# Patient Record
Sex: Female | Born: 1980 | Race: White | Hispanic: No | Marital: Married | Smoking: Never smoker
Health system: Southern US, Community
[De-identification: ages and names within clinical notes are randomized; demographics above are authoritative.]

## PROBLEM LIST (undated history)

## (undated) DIAGNOSIS — Z9221 Personal history of antineoplastic chemotherapy: Secondary | ICD-10-CM

## (undated) DIAGNOSIS — G620 Drug-induced polyneuropathy: Secondary | ICD-10-CM

## (undated) DIAGNOSIS — Z9889 Other specified postprocedural states: Secondary | ICD-10-CM

## (undated) DIAGNOSIS — C50919 Malignant neoplasm of unspecified site of unspecified female breast: Secondary | ICD-10-CM

## (undated) DIAGNOSIS — F419 Anxiety disorder, unspecified: Secondary | ICD-10-CM

## (undated) DIAGNOSIS — T8859XA Other complications of anesthesia, initial encounter: Secondary | ICD-10-CM

## (undated) DIAGNOSIS — R7303 Prediabetes: Secondary | ICD-10-CM

## (undated) DIAGNOSIS — M1990 Unspecified osteoarthritis, unspecified site: Secondary | ICD-10-CM

## (undated) DIAGNOSIS — J45909 Unspecified asthma, uncomplicated: Secondary | ICD-10-CM

## (undated) DIAGNOSIS — J189 Pneumonia, unspecified organism: Secondary | ICD-10-CM

## (undated) DIAGNOSIS — R519 Headache, unspecified: Secondary | ICD-10-CM

## (undated) DIAGNOSIS — Z923 Personal history of irradiation: Secondary | ICD-10-CM

## (undated) DIAGNOSIS — R9389 Abnormal findings on diagnostic imaging of other specified body structures: Secondary | ICD-10-CM

## (undated) DIAGNOSIS — K5909 Other constipation: Secondary | ICD-10-CM

## (undated) HISTORY — PX: MASTECTOMY, RADICAL: SHX710

## (undated) HISTORY — PX: BREAST LUMPECTOMY: SHX2

---

## 2010-10-25 HISTORY — PX: ORIF ELBOW FRACTURE: SUR928

## 2011-10-26 HISTORY — PX: ELBOW HARDWARE REMOVAL: SHX1493

## 2015-10-26 DIAGNOSIS — Z170 Estrogen receptor positive status [ER+]: Secondary | ICD-10-CM

## 2015-10-26 DIAGNOSIS — C50811 Malignant neoplasm of overlapping sites of right female breast: Secondary | ICD-10-CM

## 2015-10-26 HISTORY — DX: Estrogen receptor positive status (ER+): Z17.0

## 2015-10-26 HISTORY — DX: Estrogen receptor positive status (ER+): C50.811

## 2016-05-18 HISTORY — PX: BREAST LUMPECTOMY WITH AXILLARY LYMPH NODE DISSECTION: SHX5756

## 2016-10-08 HISTORY — PX: MODIFIED RADICAL MASTECTOMY W/ AXILLARY LYMPH NODE DISSECTION: SHX2042

## 2017-07-14 ENCOUNTER — Ambulatory Visit (HOSPITAL_COMMUNITY)
Admission: EM | Admit: 2017-07-14 | Discharge: 2017-07-14 | Disposition: A | Discharge status: Home / Self Care | Payer: Federal, State, Local not specified - PPO

## 2017-07-14 ENCOUNTER — Ambulatory Visit (INDEPENDENT_AMBULATORY_CARE_PROVIDER_SITE_OTHER): Payer: Federal, State, Local not specified - PPO

## 2017-07-14 ENCOUNTER — Encounter (HOSPITAL_COMMUNITY): Payer: Self-pay | Admitting: Emergency Medicine

## 2017-07-14 DIAGNOSIS — J209 Acute bronchitis, unspecified: Secondary | ICD-10-CM

## 2017-07-14 HISTORY — DX: Malignant neoplasm of unspecified site of unspecified female breast: C50.919

## 2017-07-14 MED ORDER — PROMETHAZINE-DM 6.25-15 MG/5ML PO SYRP: 5.0000 mL | ORAL_SOLUTION | Freq: Four times a day (QID) | ORAL | 0 refills | Status: DC | PRN

## 2017-07-14 MED ORDER — AZITHROMYCIN 250 MG PO TABS: 250.0000 mg | ORAL_TABLET | Freq: Every day | ORAL | 0 refills | Status: DC

## 2017-07-14 NOTE — ED Provider Notes (Signed)
[place]    CSN: [id number] Arrival date & time: 07/14/17  1142     History   Chief Complaint Chief Complaint  Patient presents with  . Cough    HPI Micheale Malmquist is a 36 y.o. female.   Subjective:   Cheryal Salas is a 36 y.o. female with a history of breast cancer s/p double mastectomy/lumpetomy/chemotherapy that presents for evaluation of a cough.  The cough is productive and has been waxing and waning over time. It is aggravated by nothing. She has tried several types of OTC therapies without any relief. Onset of symptoms was 9 days ago and has been unchanged since that time.  Patient denies any shortness of breath, nausea, vomiting, palpitations, lower extremity edema, calf pain/tenderness, chest pain, congestion, fevers, sweats, chills or other URI type symptoms. Patient does not have a history of asthma. Patient has not had recent travel. Patient does not have a history of smoking. Patient has not had a previous chest x-ray. Patient has not had a PPD done. Last chemotherapy June 2018.   The following portions of the patient's history were reviewed and updated as appropriate: allergies, current medications, past family history, past medical history, past social history, past surgical history and problem list.          Past Medical History:  Diagnosis Date  . Breast cancer ([place])     There are no active problems to display for this patient.   Past Surgical History:  Procedure Laterality Date  . MASTECTOMY, RADICAL      OB History    No data available       Home Medications    Prior to Admission medications   Medication Sig Start Date End Date Taking? Authorizing Provider  tamoxifen (NOLVADEX) 20 MG tablet Take 20 mg by mouth daily.   Yes [provider]    Family History No family history on file.  Social History Social History  Substance Use Topics  . Smoking status: Never Smoker  . Smokeless tobacco: Never Used  .  Alcohol use No     Allergies   Penicillins   Review of Systems Review of Systems  Constitutional: Negative for chills and fever.  HENT: Negative for congestion and postnasal drip.   Respiratory: Positive for cough. Negative for chest tightness, shortness of breath and wheezing.   Cardiovascular: Negative for chest pain, palpitations and leg swelling.  All other systems reviewed and are negative.    Physical Exam Triage Vital Signs ED Triage Vitals  Enc Vitals Group     BP 07/14/17 1242 130/77     Pulse Rate 07/14/17 1240 92     Resp 07/14/17 1240 16     Temp 07/14/17 1240 98.6 F (37 C)     Temp Source 07/14/17 1240 Oral     SpO2 07/14/17 1240 99 %     Weight 07/14/17 1241 138 lb (62.6 kg)     Height 07/14/17 1241 5' (1.524 m)     Head Circumference --      Peak Flow --      Pain Score 07/14/17 1241 5     Pain Loc --      Pain Edu? --      Excl. in [place]? --    No data found.   Updated Vital Signs BP 130/77   Pulse 92   Temp 98.6 F (37 C) (Oral)   Resp 16   Ht 5' (1.524 m)   Wt 138 lb (  62.6 kg)   SpO2 99%   BMI 26.95 kg/m   Visual Acuity Right Eye Distance:   Left Eye Distance:   Bilateral Distance:    Right Eye Near:   Left Eye Near:    Bilateral Near:     Physical Exam  Constitutional: She is oriented to person, place, and time. She appears well-developed and well-nourished.  Neck: Normal range of motion.  Cardiovascular: Normal rate and regular rhythm.   Pulmonary/Chest: Effort normal and breath sounds normal. No respiratory distress. She has no wheezes. She has no rales. She exhibits no tenderness.  Musculoskeletal: Normal range of motion.  Neurological: She is alert and oriented to person, place, and time.  Skin: Skin is warm and dry.  Psychiatric: She has a normal mood and affect.     UC Treatments / Results  Labs (all labs ordered are listed, but only abnormal results are displayed) Labs Reviewed - No data to display  EKG  EKG  Interpretation None       Radiology Dg Chest 2 View  Result Date: 07/14/2017 CLINICAL DATA:  Cough 9 days.  History breast cancer EXAM: CHEST  2 VIEW COMPARISON:  None. FINDINGS: The heart size and mediastinal contours are within normal limits. Both lungs are clear. The visualized skeletal structures are unremarkable. IMPRESSION: No active cardiopulmonary disease. Electronically Signed   By: Franchot Gallo M.D.   On: 07/14/2017 13:23    Procedures Procedures (including critical care time)  Medications Ordered in UC Medications - No data to display   Initial Impression / Assessment and Plan / UC Course  I have reviewed the triage vital signs and the nursing notes.  Pertinent labs & imaging results that were available during my care of the patient were reviewed by me and considered in my medical decision making (see chart for details).    Shawny Borkowski is a 36 y.o. female with a history of breast cancer s/p double mastectomy/lumpetomy/chemotherapy that presents with a 9-day history of productive cough. No shortness of breath, nausea, vomiting, palpitations, lower extremity edema, calf pain/tenderness, chest pain, congestion, fevers, sweats, chills or other URI type symptoms. Low suspicion for PE at this time.  WELLS SCORE FOR PE 1 = low probability for PE. CXR negative for anything acute. Due to her hx of breast cancer and immunocompromised status, will prescribe z-pack and PRN decongestants. Antibiotics per medication orders. Antitussives per medication orders. Avoid exposure to tobacco smoke and fumes. Call if shortness of breath worsens, blood in sputum, change in character of cough, development of fever or chills, inability to maintain nutrition and hydration. Avoid exposure to tobacco smoke and fumes. Follow-up PRN     Final Clinical Impressions(s) / UC Diagnoses   Final diagnoses:  Acute bronchitis, unspecified organism    New Prescriptions New Prescriptions   No  medications on file     Controlled Substance Prescriptions [place] Controlled Substance Registry consulted? Not Applicable   Enrique Sack, [place] 07/14/17 1351

## 2017-07-14 NOTE — ED Triage Notes (Signed)
PT reports productive cough for 9 days. PT finished chemo in June

## 2017-12-22 ENCOUNTER — Ambulatory Visit (INDEPENDENT_AMBULATORY_CARE_PROVIDER_SITE_OTHER): Payer: No Typology Code available for payment source | Admitting: Primary Care

## 2017-12-22 ENCOUNTER — Encounter: Payer: Self-pay | Admitting: Primary Care

## 2017-12-22 VITALS — BP 106/66 | HR 76 | Temp 98.2°F | Ht 59.75 in | Wt 140.5 lb

## 2017-12-22 DIAGNOSIS — C50919 Malignant neoplasm of unspecified site of unspecified female breast: Secondary | ICD-10-CM | POA: Insufficient documentation

## 2017-12-22 DIAGNOSIS — C50911 Malignant neoplasm of unspecified site of right female breast: Secondary | ICD-10-CM

## 2017-12-22 DIAGNOSIS — Z7689 Persons encountering health services in other specified circumstances: Secondary | ICD-10-CM

## 2017-12-22 NOTE — Assessment & Plan Note (Signed)
Diagnosed in June 2017 in [place]. Now following with [facility], follows monthly. Stable on letrozole.

## 2017-12-22 NOTE — Patient Instructions (Signed)
It was a pleasure to meet you today! Please don't hesitate to call or message me with any questions. Welcome to [place]!   

## 2017-12-22 NOTE — Progress Notes (Signed)
Subjective:    Patient ID: Monica Black, female    DOB: 07/04/1981, 37 y.o.   MRN: [id number]  HPI  Monica Black is a 37 year old female who presents today to establish care and discuss the problems mentioned below. Will obtain old records. Her last physical was several years ago, Pap smear was in 2015 or 2016.   1) Breast Cancer: Diagnosed in June 2017, located to the right breast. Underwent surgical lymph node removal, chemotherapy. She then underwent bilateral mastectomy in December 2017. Currently managed on letrozole 2.5 mg and is following with Oncology through [facility]. BRCA negative.     Review of Systems  Constitutional: Negative for unexpected weight change.  Respiratory: Negative for shortness of breath.   Cardiovascular: Negative for chest pain and palpitations.  Neurological: Negative for dizziness and headaches.       Past Medical History:  Diagnosis Date  . Breast cancer ([facility])      Social History   Socioeconomic History  . Marital status: Married    Spouse name: Not on file  . Number of children: Not on file  . Years of education: Not on file  . Highest education level: Not on file  Social Needs  . Financial resource strain: Not on file  . Food insecurity - worry: Not on file  . Food insecurity - inability: Not on file  . Transportation needs - medical: Not on file  . Transportation needs - non-medical: Not on file  Occupational History  . Not on file  Tobacco Use  . Smoking status: Never Smoker  . Smokeless tobacco: Never Used  Substance and Sexual Activity  . Alcohol use: No  . Drug use: No  . Sexual activity: Not on file  Other Topics Concern  . Not on file  Social History Narrative   Married.   No children.   Will be working with [employer] through the [employer] [place].   Enjoys shopping, movies, yoga.      Past Surgical History:  Procedure Laterality Date  . MASTECTOMY, RADICAL      Family History  Problem Relation  Age of Onset  . Asthma Father   . Lung cancer Paternal Grandfather     Allergies  Allergen Reactions  . Acetaminophen Itching  . Penicillins Hives    Current Outpatient Medications on File Prior to Visit  Medication Sig Dispense Refill  . letrozole (FEMARA) 2.5 MG tablet Take 2.5 mg by mouth daily.     No current facility-administered medications on file prior to visit.     BP 106/66   Pulse 76   Temp 98.2 F (36.8 C) (Oral)   Ht 4' 11.75" (1.518 m)   Wt 140 lb 8 oz (63.7 kg)   SpO2 98%   BMI 27.67 kg/m    Objective:   Physical Exam  Constitutional: She appears well-nourished.  Neck: Neck supple.  Cardiovascular: Normal rate and regular rhythm.  Pulmonary/Chest: Effort normal and breath sounds normal.  Skin: Skin is warm and dry.  Psychiatric: She has a normal mood and affect.          Assessment & Plan:   

## 2018-03-01 ENCOUNTER — Encounter: Payer: Self-pay | Admitting: Primary Care

## 2018-03-01 ENCOUNTER — Ambulatory Visit (INDEPENDENT_AMBULATORY_CARE_PROVIDER_SITE_OTHER): Payer: No Typology Code available for payment source | Admitting: Primary Care

## 2018-03-01 VITALS — BP 118/68 | HR 68 | Temp 98.2°F | Ht 59.75 in | Wt 145.8 lb

## 2018-03-01 DIAGNOSIS — Z0000 Encounter for general adult medical examination without abnormal findings: Secondary | ICD-10-CM | POA: Insufficient documentation

## 2018-03-01 LAB — LIPID PANEL
CHOL/HDL RATIO: 4
Cholesterol: 210 mg/dL — ABNORMAL HIGH (ref 0–200)
HDL: 50.2 mg/dL (ref >39.00)
LDL Cholesterol: 123 mg/dL — ABNORMAL HIGH (ref 0–99)
NONHDL: 159.7
Triglycerides: 186 mg/dL — ABNORMAL HIGH (ref 0.0–149.0)
VLDL: 37.2 mg/dL (ref 0.0–40.0)

## 2018-03-01 LAB — BASIC METABOLIC PANEL
BUN: 13 mg/dL (ref 6–23)
CO2: 29 meq/L (ref 19–32)
CREATININE: 0.75 mg/dL (ref 0.40–1.20)
Calcium: 9.9 mg/dL (ref 8.4–10.5)
Chloride: 102 mEq/L (ref 96–112)
GFR: 92.25 mL/min (ref >60.00)
Glucose, Bld: 91 mg/dL (ref 70–99)
Potassium: 3.9 mEq/L (ref 3.5–5.1)
Sodium: 140 mEq/L (ref 135–145)

## 2018-03-01 LAB — VITAMIN D 25 HYDROXY (VIT D DEFICIENCY, FRACTURES): VITD: 47.04 ng/mL (ref 30.00–100.00)

## 2018-03-01 NOTE — Patient Instructions (Addendum)
Stop by the lab prior to leaving today. I will notify you of your results once received.   Please schedule an appointment for your pap smear at your convenience.  Start exercising. You should be getting 150 minutes of moderate intensity exercise weekly.  Continue eating a healthy diet.  It was a pleasure to see you today!   Preventive Care 18-39 Years, Female Preventive care refers to lifestyle choices and visits with your health care provider that can promote health and wellness. What does preventive care include?  A yearly physical exam. This is also called an annual well check.  Dental exams once or twice a year.  Routine eye exams. Ask your health care provider how often you should have your eyes checked.  Personal lifestyle choices, including: ? Daily care of your teeth and gums. ? Regular physical activity. ? Eating a healthy diet. ? Avoiding tobacco and drug use. ? Limiting alcohol use. ? Practicing safe sex. ? Taking vitamin and mineral supplements as recommended by your health care provider. What happens during an annual well check? The services and screenings done by your health care provider during your annual well check will depend on your age, overall health, lifestyle risk factors, and family history of disease. Counseling Your health care provider may ask you questions about your:  Alcohol use.  Tobacco use.  Drug use.  Emotional well-being.  Home and relationship well-being.  Sexual activity.  Eating habits.  Work and work Statistician.  Method of birth control.  Menstrual cycle.  Pregnancy history.  Screening You may have the following tests or measurements:  Height, weight, and BMI.  Diabetes screening. This is done by checking your blood sugar (glucose) after you have not eaten for a while (fasting).  Blood pressure.  Lipid and cholesterol levels. These may be checked every 5 years starting at age 4.  Skin check.  Hepatitis C  blood test.  Hepatitis B blood test.  Sexually transmitted disease (STD) testing.  BRCA-related cancer screening. This may be done if you have a family history of breast, ovarian, tubal, or peritoneal cancers.  Pelvic exam and Pap test. This may be done every 3 years starting at age 60. Starting at age 39, this may be done every 5 years if you have a Pap test in combination with an HPV test.  Discuss your test results, treatment options, and if necessary, the need for more tests with your health care provider. Vaccines Your health care provider may recommend certain vaccines, such as:  Influenza vaccine. This is recommended every year.  Tetanus, diphtheria, and acellular pertussis (Tdap, Td) vaccine. You may need a Td booster every 10 years.  Varicella vaccine. You may need this if you have not been vaccinated.  HPV vaccine. If you are 29 or younger, you may need three doses over 6 months.  Measles, mumps, and rubella (MMR) vaccine. You may need at least one dose of MMR. You may also need a second dose.  Pneumococcal 13-valent conjugate (PCV13) vaccine. You may need this if you have certain conditions and were not previously vaccinated.  Pneumococcal polysaccharide (PPSV23) vaccine. You may need one or two doses if you smoke cigarettes or if you have certain conditions.  Meningococcal vaccine. One dose is recommended if you are age 40-21 years and a first-year college student living in a residence hall, or if you have one of several medical conditions. You may also need additional booster doses.  Hepatitis A vaccine. You may need this if  you have certain conditions or if you travel or work in places where you may be exposed to hepatitis A.  Hepatitis B vaccine. You may need this if you have certain conditions or if you travel or work in places where you may be exposed to hepatitis B.  Haemophilus influenzae type b (Hib) vaccine. You may need this if you have certain risk  factors.  Talk to your health care provider about which screenings and vaccines you need and how often you need them. This information is not intended to replace advice given to you by your health care provider. Make sure you discuss any questions you have with your health care provider. Document Released: 12/07/2001 Document Revised: 06/30/2016 Document Reviewed: 08/12/2015 Elsevier Interactive Patient Education  [employer].

## 2018-03-01 NOTE — Assessment & Plan Note (Signed)
Td due, kindly declines today. Pap smear due, she will schedule another appointment to have this done. Commended her on a healthy diet, recommended regular exercise. Exam unremarkable. Labs pending. Follow up in 1 year for CPE.

## 2018-03-01 NOTE — Progress Notes (Signed)
Subjective:    Patient ID: Monica Black, female    DOB: 1981-06-22, 37 y.o.   MRN: [id number]  HPI  Monica Black is a 37 year old female who presents today for complete physical. She is also needing form completed for foster care application.   Immunizations: -Tetanus: Unsure, believes it's been over 10 years, declines today. -Influenza: Did not complete last season   Diet: She endorses a healthy diet which is 90% organic. Breakfast: Fruit, rolled oats Lunch: Salad, veggies Dinner: Chicken, vegetables, potatoes, rice Snacks: Occasional handful of nuts, chips Desserts: Ice cream three times weekly Beverages: Coffee, water, occasional soda  Exercise: She is not exercising Eye exam: Completed in May 2019 Dental exam: No recent exam Pap Smear: Completed in 2015    Review of Systems  Constitutional: Negative for unexpected weight change.  HENT: Negative for rhinorrhea.   Respiratory: Negative for cough and shortness of breath.   Cardiovascular: Negative for chest pain.  Gastrointestinal: Negative for constipation and diarrhea.  Genitourinary: Negative for difficulty urinating and menstrual problem.  Musculoskeletal: Negative for arthralgias and myalgias.  Skin: Negative for rash.  Allergic/Immunologic: Negative for environmental allergies.  Neurological: Negative for dizziness, numbness and headaches.  Psychiatric/Behavioral: The patient is not nervous/anxious.        Past Medical History:  Diagnosis Date  . Breast cancer [facility])      Social History   Socioeconomic History  . Marital status: Married    Spouse name: Not on file  . Number of children: Not on file  . Years of education: Not on file  . Highest education level: Not on file  Occupational History  . Not on file  Social Needs  . Financial resource strain: Not on file  . Food insecurity:    Worry: Not on file    Inability: Not on file  . Transportation needs:    Medical: Not on file    Non-medical:  Not on file  Tobacco Use  . Smoking status: Never Smoker  . Smokeless tobacco: Never Used  Substance and Sexual Activity  . Alcohol use: No  . Drug use: No  . Sexual activity: Not on file  Lifestyle  . Physical activity:    Days per week: Not on file    Minutes per session: Not on file  . Stress: Not on file  Relationships  . Social connections:    Talks on phone: Not on file    Gets together: Not on file    Attends religious service: Not on file    Active member of club or organization: Not on file    Attends meetings of clubs or organizations: Not on file    Relationship status: Not on file  . Intimate partner violence:    Fear of current or ex partner: Not on file    Emotionally abused: Not on file    Physically abused: Not on file    Forced sexual activity: Not on file  Other Topics Concern  . Not on file  Social History Narrative   Married.   No children.   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   Enjoys shopping, movies, yoga.      Past Surgical History:  Procedure Laterality Date  . MASTECTOMY, RADICAL      Family History  Problem Relation Age of Onset  . Asthma Father   . Lung cancer Paternal Grandfather     Allergies  Allergen Reactions  . Acetaminophen  Itching  . Penicillins Hives    Current Outpatient Medications on File Prior to Visit  Medication Sig Dispense Refill  . letrozole (FEMARA) 2.5 MG tablet Take 2.5 mg by mouth daily.     No current facility-administered medications on file prior to visit.     BP 118/68   Pulse 68   Temp 98.2 F (36.8 C) (Oral)   Ht 4' 11.75" (1.518 m)   Wt 145 lb 12 oz (66.1 kg)   SpO2 98%   BMI 28.70 kg/m    Objective:   Physical Exam  Constitutional: She is oriented to person, place, and time. She appears well-nourished.  HENT:  Right Ear: Tympanic membrane and ear canal normal.  Left Ear: Tympanic membrane and ear canal normal.  Nose: Nose normal.  Mouth/Throat:  Oropharynx is clear and moist.  Eyes: Pupils are equal, round, and reactive to light. Conjunctivae and EOM are normal.  Neck: Neck supple. No thyromegaly present.  Cardiovascular: Normal rate and regular rhythm.  No murmur heard. Pulmonary/Chest: Effort normal and breath sounds normal. She has no rales.  Abdominal: Soft. Bowel sounds are normal. There is no tenderness.  Musculoskeletal: Normal range of motion.  Lymphadenopathy:    She has no cervical adenopathy.  Neurological: She is alert and oriented to person, place, and time. She has normal reflexes. No cranial nerve deficit.  Skin: Skin is warm and dry. No rash noted.  Psychiatric: She has a normal mood and affect.          Assessment & Plan:

## 2018-05-01 ENCOUNTER — Emergency Department
Admission: EM | Admit: 2018-05-01 | Discharge: 2018-05-02 | Disposition: A | Discharge status: Home / Self Care | Payer: No Typology Code available for payment source | Attending: Emergency Medicine | Admitting: Emergency Medicine

## 2018-05-01 ENCOUNTER — Encounter: Payer: Self-pay | Admitting: *Deleted

## 2018-05-01 ENCOUNTER — Other Ambulatory Visit: Payer: Self-pay

## 2018-05-01 DIAGNOSIS — R1031 Right lower quadrant pain: Secondary | ICD-10-CM

## 2018-05-01 DIAGNOSIS — Z79899 Other long term (current) drug therapy: Secondary | ICD-10-CM | POA: Diagnosis not present

## 2018-05-01 DIAGNOSIS — Z853 Personal history of malignant neoplasm of breast: Secondary | ICD-10-CM | POA: Diagnosis not present

## 2018-05-01 LAB — COMPREHENSIVE METABOLIC PANEL
ALK PHOS: 89 U/L (ref 38–126)
ALT: 30 U/L (ref 0–44)
AST: 31 U/L (ref 15–41)
Albumin: 4.3 g/dL (ref 3.5–5.0)
Anion gap: 10 (ref 5–15)
BUN: 15 mg/dL (ref 6–20)
CALCIUM: 9.4 mg/dL (ref 8.9–10.3)
CO2: 26 mmol/L (ref 22–32)
CREATININE: 0.75 mg/dL (ref 0.44–1.00)
Chloride: 104 mmol/L (ref 98–111)
GFR calc Af Amer: >60 mL/min (ref >60)
GFR calc non Af Amer: >60 mL/min (ref >60)
GLUCOSE: 113 mg/dL — AB (ref 70–99)
Potassium: 3.8 mmol/L (ref 3.5–5.1)
Sodium: 140 mmol/L (ref 135–145)
Total Bilirubin: 0.5 mg/dL (ref 0.3–1.2)
Total Protein: 7.7 g/dL (ref 6.5–8.1)

## 2018-05-01 LAB — URINALYSIS, COMPLETE (UACMP) WITH MICROSCOPIC
Bacteria, UA: NONE SEEN
Bilirubin Urine: NEGATIVE
GLUCOSE, UA: NEGATIVE mg/dL
Ketones, ur: NEGATIVE mg/dL
Nitrite: NEGATIVE
Protein, ur: NEGATIVE mg/dL
SPECIFIC GRAVITY, URINE: 1.003 — AB (ref 1.005–1.030)
Squamous Epithelial / LPF: NONE SEEN (ref 0–5)
pH: 6 (ref 5.0–8.0)

## 2018-05-01 LAB — TROPONIN I

## 2018-05-01 LAB — CBC
HCT: 39.1 % (ref 35.0–47.0)
Hemoglobin: 13.9 g/dL (ref 12.0–16.0)
MCH: 30.7 pg (ref 26.0–34.0)
MCHC: 35.6 g/dL (ref 32.0–36.0)
MCV: 86.4 fL (ref 80.0–100.0)
PLATELETS: 355 10*3/uL (ref 150–440)
RBC: 4.53 MIL/uL (ref 3.80–5.20)
RDW: 12.6 % (ref 11.5–14.5)
WBC: 7.2 10*3/uL (ref 3.6–11.0)

## 2018-05-01 LAB — LIPASE, BLOOD: LIPASE: 35 U/L (ref 11–51)

## 2018-05-01 LAB — POCT PREGNANCY, URINE: Preg Test, Ur: NEGATIVE

## 2018-05-01 NOTE — ED Triage Notes (Signed)
Pt has right side abd pain with cramping.  No v/d.  Pt also reports chest tightness. no sob.  Hx breast cancer.  Pt alert sppech clear.

## 2018-05-01 NOTE — ED Provider Notes (Signed)
[facility] Emergency Department Provider Note  ____________________________________________   First MD Initiated Contact with Patient 05/01/18 2300     (approximate)  I have reviewed the triage vital signs and the nursing notes.   HISTORY  Chief Complaint Abdominal Pain   HPI Monica Black is a 37 y.o. female who comes to the emergency department with several days of right-sided abdominal pain and cramping.  She has a past medical history of stage III breast cancer in remission.  She had bilateral mastectomies and is status post chemotherapy and radiation and is currently taking an immune modulator.  Her symptoms are in her right upper quadrant and right lower quadrant.  They are intermittent and cramping.  Mild severity nonradiating.  Nothing seems to make them better or worse.  No diarrhea.  No history of abdominal surgeries.  She denies fevers or chills.    Past Medical History:  Diagnosis Date  . Breast cancer [facility])     Patient Active Problem List   Diagnosis Date Noted  . Preventative health care 03/01/2018  . Breast cancer ([place]) 12/22/2017    Past Surgical History:  Procedure Laterality Date  . MASTECTOMY, RADICAL      Prior to Admission medications   Medication Sig Start Date End Date Taking? Authorizing Provider  letrozole (FEMARA) 2.5 MG tablet Take 2.5 mg by mouth daily.    [provider]    Allergies Acetaminophen and Penicillins  Family History  Problem Relation Age of Onset  . Asthma Father   . Lung cancer Paternal Grandfather     Social History Social History   Tobacco Use  . Smoking status: Never Smoker  . Smokeless tobacco: Never Used  Substance Use Topics  . Alcohol use: No  . Drug use: No    Review of Systems Constitutional: No fever/chills Eyes: No visual changes. ENT: No sore throat. Cardiovascular: Denies chest pain. Respiratory: Denies shortness of breath. Gastrointestinal: Positive for  abdominal pain.  No nausea, no vomiting.  No diarrhea.  No constipation. Genitourinary: Negative for dysuria. Musculoskeletal: Negative for back pain. Skin: Negative for rash. Neurological: Negative for headaches, focal weakness or numbness.   ____________________________________________   PHYSICAL EXAM:  VITAL SIGNS: ED Triage Vitals  Enc Vitals Group     BP 05/01/18 2100 (!) 155/86     Pulse Rate 05/01/18 2100 (!) 101     Resp 05/01/18 2100 19     Temp 05/01/18 2100 (!) 100.8 F (38.2 C)     Temp Source 05/01/18 2100 Oral     SpO2 05/01/18 2100 100 %     Weight 05/01/18 2004 145 lb (65.8 kg)     Height 05/01/18 2004 5' (1.524 m)     Head Circumference --      Peak Flow --      Pain Score --      Pain Loc --      Pain Edu? --      Excl. in [place]? --     Constitutional: Alert and oriented x4 pleasant cooperative no distress Eyes: PERRL EOMI. Head: Atraumatic. Nose: No congestion/rhinnorhea. Mouth/Throat: No trismus Neck: No stridor.   Cardiovascular: Normal rate, regular rhythm. Grossly normal heart sounds.  Good peripheral circulation. Respiratory: Normal respiratory effort.  No retractions. Lungs CTAB and moving good air Gastrointestinal: Soft mild right lower quadrant and right upper quadrant tenderness with no rebound no guarding or peritonitis no McBurney's tenderness negative Rovsing's and no costovertebral tenderness Musculoskeletal: No lower extremity  edema   Neurologic:  Normal speech and language. No gross focal neurologic deficits are appreciated. Skin:  Skin is warm, dry and intact. No rash noted. Psychiatric: Mood and affect are normal. Speech and behavior are normal.    ____________________________________________   DIFFERENTIAL includes but not limited to  Appendicitis, biliary colic, cholecystitis, pyelonephritis, nephrolithiasis ____________________________________________   LABS (all labs ordered are listed, but only abnormal results are  displayed)  Labs Reviewed  COMPREHENSIVE METABOLIC PANEL - Abnormal; Notable for the following components:      Result Value   Glucose, Bld 113 (*)    All other components within normal limits  URINALYSIS, COMPLETE (UACMP) WITH MICROSCOPIC - Abnormal; Notable for the following components:   Color, Urine STRAW (*)    APPearance CLEAR (*)    Specific Gravity, Urine 1.003 (*)    Hgb urine dipstick SMALL (*)    Leukocytes, UA SMALL (*)    All other components within normal limits  LIPASE, BLOOD  CBC  TROPONIN I  POCT PREGNANCY, URINE    Lab work reviewed by me with no acute disease __________________________________________  EKG  ED ECG REPORT I, Darel Hong, the attending physician, personally viewed and interpreted this ECG.  Date: 05/03/2018 EKG Time:  Rate: 78 Rhythm: normal sinus rhythm QRS Axis: normal Intervals: normal ST/T Wave abnormalities: normal Narrative Interpretation: no evidence of acute ischemia  ____________________________________________  RADIOLOGY  CT abdomen pelvis reviewed by me with no acute disease ____________________________________________   PROCEDURES  Procedure(s) performed: no  Procedures  Critical Care performed: no  ____________________________________________   INITIAL IMPRESSION / ASSESSMENT AND PLAN / ED COURSE  Pertinent labs & imaging results that were available during my care of the patient were reviewed by me and considered in my medical decision making (see chart for details).   Patient arrives well-appearing and hemodynamically stable although with 100.8 degrees oral temperature and new abdominal pain.  Differential is broad but includes appendicitis and other intra-abdominal infection so labs, urinalysis, and CT scan are pending.  Defer antibiotics at this point as she is clearly not septic.  Fortunately the patient's symptoms are improved and her CT scan is unremarkable.  She feels significant relief.  I  explained to the patient that diagnostic uncertainty still exists but I was comfortable having her follow-up with her primary care physician.  Strict return precautions have been given and patient verbalizes understanding and agreement with the plan.      ____________________________________________   FINAL CLINICAL IMPRESSION(S) / ED DIAGNOSES  Final diagnoses:  Right lower quadrant abdominal pain      NEW MEDICATIONS STARTED DURING THIS VISIT:  Discharge Medication List as of 05/02/2018  3:05 AM       Note:  This document was prepared using Dragon voice recognition software and may include unintentional dictation errors.     Darel Hong, MD 05/03/18 1214

## 2018-05-02 ENCOUNTER — Emergency Department: Payer: No Typology Code available for payment source

## 2018-05-02 ENCOUNTER — Encounter: Payer: Self-pay | Admitting: Radiology

## 2018-05-02 MED ORDER — IOHEXOL 300 MG/ML  SOLN
100.0000 mL | Freq: Once | INTRAMUSCULAR | Status: AC | PRN
  Administered 2018-05-02: 100 mL via INTRAVENOUS

## 2018-05-02 NOTE — Discharge Instructions (Signed)
Fortunately today your blood work, your urine, and your CT scan were reassuring.  Please follow-up with your primary care physician in 2 days for recheck and return to the emergency department sooner for any concerns.  It was a pleasure to take care of you today, and thank you for coming to our emergency department.  If you have any questions or concerns before leaving please ask the nurse to grab me and I'm more than happy to go through your aftercare instructions again.  If you were prescribed any opioid pain medication today such as Norco, Vicodin, Percocet, morphine, hydrocodone, or oxycodone please make sure you do not drive when you are taking this medication as it can alter your ability to drive safely.  If you have any concerns once you are home that you are not improving or are in fact getting worse before you can make it to your follow-up appointment, please do not hesitate to call 911 and come back for further evaluation.  Darel Hong, MD  Results for orders placed or performed during the hospital encounter of 05/01/18  Lipase, blood  Result Value Ref Range   Lipase 35 11 - 51 U/L  Comprehensive metabolic panel  Result Value Ref Range   Sodium 140 135 - 145 mmol/L   Potassium 3.8 3.5 - 5.1 mmol/L   Chloride 104 98 - 111 mmol/L   CO2 26 22 - 32 mmol/L   Glucose, Bld 113 (H) 70 - 99 mg/dL   BUN 15 6 - 20 mg/dL   Creatinine, Ser 0.75 0.44 - 1.00 mg/dL   Calcium 9.4 8.9 - 10.3 mg/dL   Total Protein 7.7 6.5 - 8.1 g/dL   Albumin 4.3 3.5 - 5.0 g/dL   AST 31 15 - 41 U/L   ALT 30 0 - 44 U/L   Alkaline Phosphatase 89 38 - 126 U/L   Total Bilirubin 0.5 0.3 - 1.2 mg/dL   GFR calc non Af Amer >60 >60 mL/min   GFR calc Af Amer >60 >60 mL/min   Anion gap 10 5 - 15  CBC  Result Value Ref Range   WBC 7.2 3.6 - 11.0 K/uL   RBC 4.53 3.80 - 5.20 MIL/uL   Hemoglobin 13.9 12.0 - 16.0 g/dL   HCT 39.1 35.0 - 47.0 %   MCV 86.4 80.0 - 100.0 fL   MCH 30.7 26.0 - 34.0 pg   MCHC 35.6 32.0 -  36.0 g/dL   RDW 12.6 11.5 - 14.5 %   Platelets 355 150 - 440 K/uL  Urinalysis, Complete w Microscopic  Result Value Ref Range   Color, Urine STRAW (A) YELLOW   APPearance CLEAR (A) CLEAR   Specific Gravity, Urine 1.003 (L) 1.005 - 1.030   pH 6.0 5.0 - 8.0   Glucose, UA NEGATIVE NEGATIVE mg/dL   Hgb urine dipstick SMALL (A) NEGATIVE   Bilirubin Urine NEGATIVE NEGATIVE   Ketones, ur NEGATIVE NEGATIVE mg/dL   Protein, ur NEGATIVE NEGATIVE mg/dL   Nitrite NEGATIVE NEGATIVE   Leukocytes, UA SMALL (A) NEGATIVE   RBC / HPF 0-5 0 - 5 RBC/hpf   WBC, UA 6-10 0 - 5 WBC/hpf   Bacteria, UA NONE SEEN NONE SEEN   Squamous Epithelial / LPF NONE SEEN 0 - 5  Troponin I  Result Value Ref Range   Troponin I <0.03 <0.03 ng/mL  Pregnancy, urine POC  Result Value Ref Range   Preg Test, Ur NEGATIVE NEGATIVE   Ct Abdomen Pelvis W Contrast  Result Date: 05/02/2018 CLINICAL DATA:  Right-sided abdominal pain. Hematuria and bloody stools. Abdominal infection. EXAM: CT ABDOMEN AND PELVIS WITH CONTRAST TECHNIQUE: Multidetector CT imaging of the abdomen and pelvis was performed using the standard protocol following bolus administration of intravenous contrast. CONTRAST:  144mL OMNIPAQUE IOHEXOL 300 MG/ML  SOLN COMPARISON:  None. FINDINGS: Lower chest: Subpleural reticulation in the right middle lobe likely post radiation/treatment related change in patient with history of breast cancer. No pleural fluid or focal consolidation. Heart is normal in size. Hepatobiliary: No focal liver abnormality is seen. No gallstones, gallbladder wall thickening, or biliary dilatation. Pancreas: No ductal dilatation or inflammation. Spleen: Normal in size without focal abnormality. Adrenals/Urinary Tract: Normal adrenal glands. No hydronephrosis or perinephric edema. Homogeneous renal enhancement. No focal renal lesion. Urinary bladder is physiologically distended without wall thickening. Stomach/Bowel: Stomach is nondistended limiting  assessment. No bowel wall thickening, inflammatory change or obstruction. Slight fecalization of distal small bowel contents. Moderate colonic stool burden in the proximal colon. Small volume of stool distally. No abnormal rectal distention. Normal appendix, for example coronal image 35. Vascular/Lymphatic: No significant vascular findings are present. No enlarged abdominal or pelvic lymph nodes. Reproductive: Uterus and bilateral adnexa are unremarkable. Other: No free air, free fluid, or intra-abdominal fluid collection. Tiny fat containing umbilical hernia. Musculoskeletal: There are no acute or suspicious osseous abnormalities. IMPRESSION: No acute findings or explanation for abdominal pain. Electronically Signed   By: Jeb Levering M.D.   On: 05/02/2018 02:14

## 2018-10-21 ENCOUNTER — Emergency Department (HOSPITAL_COMMUNITY): Payer: No Typology Code available for payment source

## 2018-10-21 ENCOUNTER — Emergency Department (HOSPITAL_COMMUNITY)
Admission: EM | Admit: 2018-10-21 | Discharge: 2018-10-21 | Disposition: A | Discharge status: Home / Self Care | Payer: No Typology Code available for payment source | Attending: Emergency Medicine | Admitting: Emergency Medicine

## 2018-10-21 ENCOUNTER — Encounter (HOSPITAL_COMMUNITY): Payer: Self-pay

## 2018-10-21 DIAGNOSIS — R072 Precordial pain: Secondary | ICD-10-CM | POA: Insufficient documentation

## 2018-10-21 LAB — I-STAT BETA HCG BLOOD, ED (MC, WL, AP ONLY): I-stat hCG, quantitative: <5 m[IU]/mL (ref <5)

## 2018-10-21 LAB — CBC WITH DIFFERENTIAL/PLATELET
ABS IMMATURE GRANULOCYTES: 0 10*3/uL (ref 0.00–0.07)
BASOS PCT: 1 %
Basophils Absolute: 0.1 10*3/uL (ref 0.0–0.1)
Eosinophils Absolute: 0.1 10*3/uL (ref 0.0–0.5)
Eosinophils Relative: 3 %
HCT: 38.2 % (ref 36.0–46.0)
Hemoglobin: 12.6 g/dL (ref 12.0–15.0)
IMMATURE GRANULOCYTES: 0 %
Lymphocytes Relative: 39 %
Lymphs Abs: 1.5 10*3/uL (ref 0.7–4.0)
MCH: 28.8 pg (ref 26.0–34.0)
MCHC: 33 g/dL (ref 30.0–36.0)
MCV: 87.4 fL (ref 80.0–100.0)
MONO ABS: 0.4 10*3/uL (ref 0.1–1.0)
Monocytes Relative: 10 %
NEUTROS ABS: 1.8 10*3/uL (ref 1.7–7.7)
NEUTROS PCT: 47 %
PLATELETS: 276 10*3/uL (ref 150–400)
RBC: 4.37 MIL/uL (ref 3.87–5.11)
RDW: 12.6 % (ref 11.5–15.5)
WBC: 3.9 10*3/uL — AB (ref 4.0–10.5)
nRBC: 0 % (ref 0.0–0.2)

## 2018-10-21 LAB — COMPREHENSIVE METABOLIC PANEL
ALT: 15 U/L (ref 0–44)
ANION GAP: 9 (ref 5–15)
AST: 39 U/L (ref 15–41)
Albumin: 3.8 g/dL (ref 3.5–5.0)
Alkaline Phosphatase: 86 U/L (ref 38–126)
BUN: 12 mg/dL (ref 6–20)
CHLORIDE: 105 mmol/L (ref 98–111)
CO2: 26 mmol/L (ref 22–32)
Calcium: 9.5 mg/dL (ref 8.9–10.3)
Creatinine, Ser: 0.7 mg/dL (ref 0.44–1.00)
GFR calc non Af Amer: >60 mL/min (ref >60)
Glucose, Bld: 107 mg/dL — ABNORMAL HIGH (ref 70–99)
POTASSIUM: 5 mmol/L (ref 3.5–5.1)
Sodium: 140 mmol/L (ref 135–145)
Total Bilirubin: 1.3 mg/dL — ABNORMAL HIGH (ref 0.3–1.2)
Total Protein: 7 g/dL (ref 6.5–8.1)

## 2018-10-21 LAB — D-DIMER, QUANTITATIVE (NOT AT ARMC): D DIMER QUANT: 0.36 ug{FEU}/mL (ref 0.00–0.50)

## 2018-10-21 LAB — TROPONIN I

## 2018-10-21 MED ORDER — KETOROLAC TROMETHAMINE 30 MG/ML IJ SOLN: 30.0000 mg | Freq: Once | INTRAMUSCULAR | Status: DC

## 2018-10-21 MED ORDER — KETOROLAC TROMETHAMINE 10 MG PO TABS: 10.0000 mg | ORAL_TABLET | Freq: Four times a day (QID) | ORAL | 0 refills | Status: DC | PRN

## 2018-10-21 NOTE — Discharge Instructions (Signed)
You have been seen in the Emergency Department (ED) today for chest pain.  As we have discussed todays test results are normal, but you may require further testing.  Please follow up with the recommended doctor as instructed above in these documents regarding todays emergent visit and your recent symptoms to discuss further management.   Return to the Emergency Department (ED) if you experience any further chest pain/pressure/tightness, difficulty breathing, or sudden sweating, or other symptoms that concern you.   Chest Pain (Nonspecific) It is often hard to give a specific diagnosis for the cause of chest pain. There is always a chance that your pain could be related to something serious, such as a heart attack or a blood clot in the lungs. You need to follow up with your health care provider for further evaluation. CAUSES  Heartburn. Pneumonia or bronchitis. Anxiety or stress. Inflammation around your heart (pericarditis) or lung (pleuritis or pleurisy). A blood clot in the lung. A collapsed lung (pneumothorax). It can develop suddenly on its own (spontaneous pneumothorax) or from trauma to the chest. Shingles infection (herpes zoster virus). The chest wall is composed of bones, muscles, and cartilage. Any of these can be the source of the pain. The bones can be bruised by injury. The muscles or cartilage can be strained by coughing or overwork. The cartilage can be affected by inflammation and become sore (costochondritis). DIAGNOSIS  Lab tests or other studies may be needed to find the cause of your pain. Your health care provider may have you take a test called an ambulatory electrocardiogram (ECG). An ECG records your heartbeat patterns over a 24-hour period. You may also have other tests, such as: Transthoracic echocardiogram (TTE). During echocardiography, sound waves are used to evaluate how blood flows through your heart. Transesophageal echocardiogram (TEE). Cardiac monitoring.  This allows your health care provider to monitor your heart rate and rhythm in real time. Holter monitor. This is a portable device that records your heartbeat and can help diagnose heart arrhythmias. It allows your health care provider to track your heart activity for several days, if needed. Stress tests by exercise or by giving medicine that makes the heart beat faster. TREATMENT  Treatment depends on what may be causing your chest pain. Treatment may include: Acid blockers for heartburn. Anti-inflammatory medicine. Pain medicine for inflammatory conditions. Antibiotics if an infection is present. You may be advised to change lifestyle habits. This includes stopping smoking and avoiding alcohol, caffeine, and chocolate. You may be advised to keep your head raised (elevated) when sleeping. This reduces the chance of acid going backward from your stomach into your esophagus. Most of the time, nonspecific chest pain will improve within 2-3 days with rest and mild pain medicine.  HOME CARE INSTRUCTIONS  If antibiotics were prescribed, take them as directed. Finish them even if you start to feel better. For the next few days, avoid physical activities that bring on chest pain. Continue physical activities as directed. Do not use any tobacco products, including cigarettes, chewing tobacco, or electronic cigarettes. Avoid drinking alcohol. Only take medicine as directed by your health care provider. Follow your health care provider's suggestions for further testing if your chest pain does not go away. Keep any follow-up appointments you made. If you do not go to an appointment, you could develop lasting (chronic) problems with pain. If there is any problem keeping an appointment, call to reschedule. SEEK MEDICAL CARE IF:  Your chest pain does not go away, even after treatment. You  have a rash with blisters on your chest. You have a fever. SEEK IMMEDIATE MEDICAL CARE IF:  You have increased chest  pain or pain that spreads to your arm, neck, jaw, back, or abdomen. You have shortness of breath. You have an increasing cough, or you cough up blood. You have severe back or abdominal pain. You feel nauseous or vomit. You have severe weakness. You faint. You have chills. This is an emergency. Do not wait to see if the pain will go away. Get medical help at once. Call your local emergency services (911 in [place] Do not drive yourself to the hospital. MAKE SURE YOU:  Understand these instructions. Will watch your condition. Will get help right away if you are not doing well or get worse. Document Released: 07/21/2005 Document Revised: 10/16/2013 Document Reviewed: 05/16/2008 [facility] Patient Information 2015 [place], [place]. This information is not intended to replace advice given to you by your health care provider. Make sure you discuss any questions you have with your health care provider.

## 2018-10-21 NOTE — ED Provider Notes (Signed)
Emergency Department Provider Note   I have reviewed the triage vital signs and the nursing notes.   HISTORY  Chief Complaint Chest Pain and Back Pain   HPI Monica Black is a 37 y.o. female with PMH of breast cancer in remission presents to the emergency department with left-sided chest pain and midthoracic back pain for the past 3 days.  She describes constant pain with intermittent worsening.  No clear provoking or modifying factors.  She reports some mild shortness of breath.  No prior history of PE or DVT.  She denies any fevers, chills, productive cough.  No similar pain in the past.  Denies trauma associated with the onset of pain.  She has been taking Tylenol at home with no significant relief in symptoms.  Her last chemotherapy was in 2018.    Past Medical History:  Diagnosis Date  . Breast cancer [facility])     Patient Active Problem List   Diagnosis Date Noted  . Preventative health care 03/01/2018  . Breast cancer ([place]) 12/22/2017    Past Surgical History:  Procedure Laterality Date  . MASTECTOMY, RADICAL      Current Outpatient Rx  . Order #: [id number] Class: Print  . Order #: [id number] Class: Historical Med    Allergies Acetaminophen and Penicillins  Family History  Problem Relation Age of Onset  . Asthma Father   . Lung cancer Paternal Grandfather     Social History Social History   Tobacco Use  . Smoking status: Never Smoker  . Smokeless tobacco: Never Used  Substance Use Topics  . Alcohol use: No  . Drug use: No    Review of Systems  Constitutional: No fever/chills Eyes: No visual changes. ENT: No sore throat. Cardiovascular: Positive chest pain. Respiratory: Positive shortness of breath. Gastrointestinal: No abdominal pain.  No nausea, no vomiting.  No diarrhea.  No constipation. Genitourinary: Negative for dysuria. Musculoskeletal: Negative for back pain. Skin: Negative for rash. Neurological: Negative for headaches, focal weakness  or numbness.  10-point ROS otherwise negative.  ____________________________________________   PHYSICAL EXAM:  VITAL SIGNS: ED Triage Vitals  Enc Vitals Group     BP 10/21/18 0931 124/85     Pulse Rate 10/21/18 0931 79     Resp 10/21/18 1000 16     Temp 10/21/18 0931 98.6 F (37 C)     Temp Source 10/21/18 0931 Oral     SpO2 10/21/18 0931 99 %     Pain Score 10/21/18 0951 7   Constitutional: Alert and oriented. Well appearing and in no acute distress. Eyes: Conjunctivae are normal.  Head: Atraumatic. Nose: No congestion/rhinnorhea. Mouth/Throat: Mucous membranes are moist.  Oropharynx non-erythematous. Neck: No stridor. Cardiovascular: Normal rate, regular rhythm. Good peripheral circulation. Grossly normal heart sounds.   Respiratory: Normal respiratory effort.  No retractions. Lungs CTAB. Gastrointestinal: Soft and nontender. No distention.  Musculoskeletal: No lower extremity tenderness nor edema. No gross deformities of extremities. Neurologic:  Normal speech and language. No gross focal neurologic deficits are appreciated.  Skin:  Skin is warm, dry and intact. No rash noted.  ____________________________________________   LABS (all labs ordered are listed, but only abnormal results are displayed)  Labs Reviewed  COMPREHENSIVE METABOLIC PANEL - Abnormal; Notable for the following components:      Result Value   Glucose, Bld 107 (*)    Total Bilirubin 1.3 (*)    All other components within normal limits  CBC WITH DIFFERENTIAL/PLATELET - Abnormal; Notable for the following components:  WBC 3.9 (*)    All other components within normal limits  TROPONIN I  D-DIMER, QUANTITATIVE (NOT AT [facility])  I-STAT BETA HCG BLOOD, ED ([facility], [facility], [facility] ONLY)   ____________________________________________  EKG   EKG Interpretation  Date/Time:  Saturday October 21 2018 09:33:05 EST Ventricular Rate:  76 PR Interval:  144 QRS Duration: 78 QT Interval:  390 QTC  Calculation: 438 R Axis:   69 Text Interpretation:  Normal sinus rhythm with sinus arrhythmia Normal ECG No STEMI.  Confirmed by Nanda Quinton [phone]) on 10/21/2018 9:49:58 AM       ____________________________________________  RADIOLOGY  Dg Chest 2 View  Result Date: 10/21/2018 CLINICAL DATA:  Left-sided chest pain and thoracic and lumbar back pain. Stage III breast cancer. EXAM: CHEST - 2 VIEW COMPARISON:  07/14/2017 FINDINGS: The heart size and mediastinal contours are within normal limits. Both lungs are clear. The visualized skeletal structures are unremarkable. IMPRESSION: No active cardiopulmonary disease. Electronically Signed   By: Lorriane Shire M.D.   On: 10/21/2018 11:24    ____________________________________________   PROCEDURES  Procedure(s) performed:   Procedures  None ____________________________________________   INITIAL IMPRESSION / ASSESSMENT AND PLAN / ED COURSE  Pertinent labs & imaging results that were available during my care of the patient were reviewed by me and considered in my medical decision making (see chart for details).  Patient with past medical history of breast cancer currently in remission presents with chest pain radiating to the back.  Symptoms been constant for the past 3 days.  No infectious symptoms.  Plan for screening labs including troponin and d-dimer.  Patient's EKG is without acute ischemic changes.  Chest x-ray, labs reviewed with no acute findings.  Suspect MSK etiology.  Patient will follow-up with PCP.  I do not feel that trending biomarkers would be beneficial with several days of constant pain.  Offered reflux medication but patient refusing.  Advised warm compress, NSAIDs, and PCP follow up.   At this time, I do not feel there is any life-threatening condition present. I have reviewed and discussed all results (EKG, imaging, lab, urine as appropriate), exam findings with patient. I have reviewed nursing notes and  appropriate previous records.  I feel the patient is safe to be discharged home without further emergent workup. Discussed usual and customary return precautions. Patient and family (if present) verbalize understanding and are comfortable with this plan.  Patient will follow-up with their primary care provider. If they do not have a primary care provider, information for follow-up has been provided to them. All questions have been answered.  ____________________________________________  FINAL CLINICAL IMPRESSION(S) / ED DIAGNOSES  Final diagnoses:  Precordial chest pain     MEDICATIONS GIVEN DURING THIS VISIT:  Medications  ketorolac (TORADOL) 30 MG/ML injection 30 mg (0 mg Intravenous Hold 10/21/18 1037)     NEW OUTPATIENT MEDICATIONS STARTED DURING THIS VISIT:  Discharge Medication List as of 10/21/2018 11:51 AM    START taking these medications   Details  ketorolac (TORADOL) 10 MG tablet Take 1 tablet (10 mg total) by mouth every 6 (six) hours as needed for moderate pain., Starting Sat 10/21/2018, Print        Note:  This document was prepared using Dragon voice recognition software and may include unintentional dictation errors.  Nanda Quinton, MD Emergency Medicine    Long, [facility], MD 10/21/18 1311

## 2018-10-21 NOTE — ED Triage Notes (Signed)
Pt presents for evaluation of L sided chest pain and middle thoracic and lumbar back pain x 3 days constantly. Pt with hx of stage 3 breast cancer with double mastectomy.

## 2019-01-17 ENCOUNTER — Telehealth: Payer: Self-pay

## 2019-01-17 ENCOUNTER — Encounter: Payer: Self-pay | Admitting: Family Medicine

## 2019-01-17 ENCOUNTER — Telehealth: Payer: No Typology Code available for payment source | Admitting: Family Medicine

## 2019-01-17 DIAGNOSIS — H938X9 Other specified disorders of ear, unspecified ear: Secondary | ICD-10-CM

## 2019-01-17 NOTE — Progress Notes (Signed)
Based on what you shared with me, I feel your condition warrants further evaluation and I recommend that you be seen for a face to face office visit. I would recommend starting with your PCP for direction- below are urgent care locations that may be able to assist with evaluation.  At least 5-10 min. Were spent on this patient encounter through record and history review, suspected diagnosis research and response to patient.   NOTE: If you entered your credit card information for this eVisit, you will not be charged. You may see a "hold" on your card for the $35 but that hold will drop off and you will not have a charge processed.  If you are having a true medical emergency please call 911.  If you need an urgent face to face visit, [place] has four urgent care centers for your convenience.    PLEASE NOTE: THE [facility] LOCATIONS AND URGENT CARE CLINICS DO NOT HAVE THE TESTING FOR CORONAVIRUS COVID19 AVAILABLE.  IF YOU FEEL YOU NEED THIS TEST YOU MUST HAVE AN ORDER TO GO TO A TESTING LOCATION FROM YOUR PROVIDER OR FROM A SCREENING E-VISIT  The following sites will take your insurance:  . [facility] [place] a Provider at this Location  [address] [place], [place] [address] . 10 am to 8 pm Monday-Friday . 12 pm to 8 pm Saturday-Sunday   . [facility] [place] a Provider at this Location  [address], [place], [place] [address] . 8 am to 8 pm Monday-Friday . 9 am to 6 pm Saturday . 11 am to 6 pm Sunday   . [facility] [place] Get Driving Directions  [address] [place], [place] [address] . 8 am to 8 pm Monday-Friday . 8 am to 4 pm Saturday-Sunday   Your e-visit answers were reviewed by a board certified advanced clinical practitioner to complete your personal care plan.  Thank you for  using e-Visits.

## 2019-01-17 NOTE — Telephone Encounter (Signed)
Pt tried to have evisit this morning and was advised needs a face to face visit. For 5 days pt has had S/T and now glands appear swollen.no fever, non prod cough, SOB at nighttime,Chest tightness mid chest to rt side of chest; worsens when coughs. Chest tightness and discomfort radiates into back. No travel exposure and no known exposure to + corona virus or flu. I spoke with Ailene Ravel at [facility] and they do have xray capability. Pt will go to [facility]. FYI to Gentry Fitz NP.

## 2019-01-17 NOTE — Telephone Encounter (Signed)
Noted and agree. 

## 2019-01-18 ENCOUNTER — Encounter (HOSPITAL_COMMUNITY): Payer: Self-pay | Admitting: Emergency Medicine

## 2019-01-18 ENCOUNTER — Other Ambulatory Visit: Payer: Self-pay

## 2019-01-18 ENCOUNTER — Ambulatory Visit (INDEPENDENT_AMBULATORY_CARE_PROVIDER_SITE_OTHER): Payer: PRIVATE HEALTH INSURANCE

## 2019-01-18 ENCOUNTER — Ambulatory Visit (HOSPITAL_COMMUNITY)
Admission: EM | Admit: 2019-01-18 | Discharge: 2019-01-18 | Disposition: A | Discharge status: Home / Self Care | Payer: PRIVATE HEALTH INSURANCE | Attending: Family Medicine | Admitting: Family Medicine

## 2019-01-18 DIAGNOSIS — R0981 Nasal congestion: Secondary | ICD-10-CM | POA: Diagnosis not present

## 2019-01-18 DIAGNOSIS — J029 Acute pharyngitis, unspecified: Secondary | ICD-10-CM

## 2019-01-18 DIAGNOSIS — R05 Cough: Secondary | ICD-10-CM | POA: Diagnosis not present

## 2019-01-18 DIAGNOSIS — J22 Unspecified acute lower respiratory infection: Secondary | ICD-10-CM

## 2019-01-18 LAB — POCT RAPID STREP A: Streptococcus, Group A Screen (Direct): NEGATIVE

## 2019-01-18 MED ORDER — CETIRIZINE HCL 10 MG PO TABS: 10.0000 mg | ORAL_TABLET | Freq: Every day | ORAL | 0 refills | Status: DC

## 2019-01-18 MED ORDER — NAPROXEN 500 MG PO TABS: 500.0000 mg | ORAL_TABLET | Freq: Two times a day (BID) | ORAL | 0 refills | Status: DC

## 2019-01-18 MED ORDER — AZITHROMYCIN 250 MG PO TABS: 250.0000 mg | ORAL_TABLET | Freq: Every day | ORAL | 0 refills | Status: DC

## 2019-01-18 MED ORDER — BENZONATATE 100 MG PO CAPS: 100.0000 mg | ORAL_CAPSULE | Freq: Three times a day (TID) | ORAL | 0 refills | Status: DC

## 2019-01-18 NOTE — ED Triage Notes (Signed)
Sore throat for 6 days.  Cough for 10 days, dry cough.   Chest discomfort for 10 days.   Fever this AM of 101.4, has not had meds today.   Vomiting intermittently throughout the week.

## 2019-01-18 NOTE — ED Provider Notes (Signed)
[facility]    CSN: [id number] Arrival date & time: 01/18/19  1107     History   Chief Complaint Chief Complaint  Patient presents with  . Sore Throat  . Cough    HPI Monica Black is a 38 y.o. female.   Patient is a 38 year old female with past medical history of breast cancer.  She presents with sore throat for 6 days and dry cough for approximately 10 days. Symptoms have been constant.  She has also had some associated chest discomfort, only right side,  and mild shortness of breath. The chest discomfort is more when she moves and deep breathes but at other times when she rests.  Reporting fever of 101.4 this morning.  The fever just started.  She has not had any medication today.  She has been using over-the-counter cough and cold medications with minimal relief.  She has had some intermittent vomiting throughout the week.  Denies any current nausea.  No abdominal pain or diarrhea.  Denies any recent traveling or known sick contacts.  She does not smoke.  She denies any history of PE or DVTs.  No recent long distance traveling.  No calf pain or tenderness.  She is currently taking letrozole and receiving Zoladex injections monthly.  ROS per HPI      Past Medical History:  Diagnosis Date  . Breast cancer [facility])     Patient Active Problem List   Diagnosis Date Noted  . Preventative health care 03/01/2018  . Breast cancer ([place]) 12/22/2017    Past Surgical History:  Procedure Laterality Date  . MASTECTOMY, RADICAL      OB History   No obstetric history on file.      Home Medications    Prior to Admission medications   Medication Sig Start Date End Date Taking? Authorizing Provider  letrozole (FEMARA) 2.5 MG tablet Take 2.5 mg by mouth daily.   Yes [provider]  azithromycin (ZITHROMAX) 250 MG tablet Take 1 tablet (250 mg total) by mouth daily. Take first 2 tablets together, then 1 every day until finished. 01/18/19   Loura Halt A, NP   benzonatate (TESSALON) 100 MG capsule Take 1 capsule (100 mg total) by mouth every 8 (eight) hours. 01/18/19   Loura Halt A, NP  cetirizine (ZYRTEC) 10 MG tablet Take 1 tablet (10 mg total) by mouth daily. 01/18/19   Loura Halt A, NP  ketorolac (TORADOL) 10 MG tablet Take 1 tablet (10 mg total) by mouth every 6 (six) hours as needed for moderate pain. 10/21/18   Long, Wonda Olds, MD  naproxen (NAPROSYN) 500 MG tablet Take 1 tablet (500 mg total) by mouth 2 (two) times daily. 01/18/19   Orvan July, NP    Family History Family History  Problem Relation Age of Onset  . Asthma Father   . Lung cancer Paternal Grandfather     Social History Social History   Tobacco Use  . Smoking status: Never Smoker  . Smokeless tobacco: Never Used  Substance Use Topics  . Alcohol use: No  . Drug use: No     Allergies   Acetaminophen and Penicillins   Review of Systems Review of Systems   Physical Exam Triage Vital Signs ED Triage Vitals [01/18/19 1125]  Enc Vitals Group     BP      Pulse      Resp      Temp      Temp src  SpO2      Weight      Height      Head Circumference      Peak Flow      Pain Score 8     Pain Loc      Pain Edu?      Excl. in [place]?    No data found.  Updated Vital Signs BP 109/73   Pulse (!) 112   Temp 99.3 F (37.4 C) (Oral)   Resp 18   LMP 05/10/2016 Comment: has not had menstrual since breast cancer treatment  SpO2 99%   Visual Acuity Right Eye Distance:   Left Eye Distance:   Bilateral Distance:    Right Eye Near:   Left Eye Near:    Bilateral Near:     Physical Exam Vitals signs and nursing note reviewed.  Constitutional:      General: She is not in acute distress.    Appearance: She is well-developed. She is not ill-appearing, toxic-appearing or diaphoretic.  HENT:     Head: Normocephalic and atraumatic.     Right Ear: Tympanic membrane and ear canal normal.     Left Ear: Tympanic membrane and ear canal normal.      Mouth/Throat:     Pharynx: Oropharynx is clear. Posterior oropharyngeal erythema present.     Tonsils: No tonsillar exudate. 0 on the right. 0 on the left.  Eyes:     Conjunctiva/sclera: Conjunctivae normal.  Neck:     Musculoskeletal: Normal range of motion and neck supple.  Cardiovascular:     Rate and Rhythm: Normal rate and regular rhythm.     Heart sounds: Normal heart sounds.  Pulmonary:     Effort: Pulmonary effort is normal.     Breath sounds: Normal breath sounds.  Lymphadenopathy:     Cervical: Cervical adenopathy present.  Skin:    General: Skin is warm and dry.     Findings: No rash.  Neurological:     Mental Status: She is alert.  Psychiatric:        Mood and Affect: Mood normal.      UC Treatments / Results  Labs (all labs ordered are listed, but only abnormal results are displayed) Labs Reviewed  POCT RAPID STREP A    EKG None  Radiology Dg Chest 2 View  Result Date: 01/18/2019 CLINICAL DATA:  Cough and congestion for 10 days.  Fever today. EXAM: CHEST - 2 VIEW COMPARISON:  PA and lateral chest 10/21/2018 and 07/14/2018. FINDINGS: The lungs are clear. Heart size is normal. No pneumothorax or pleural fluid. No bony abnormality. IMPRESSION: Negative chest. Electronically Signed   By: Inge Rise M.D.   On: 01/18/2019 12:14    Procedures Procedures (including critical care time)  Medications Ordered in UC Medications - No data to display  Initial Impression / Assessment and Plan / UC Course  I have reviewed the triage vital signs and the nursing notes.  Pertinent labs & imaging results that were available during my care of the patient were reviewed by me and considered in my medical decision making (see chart for details).    Cough and sore throat hx of breat cancer, not currently on chemo but taking Zoladex and letrozole.  Chest xray was negative for PNA or bronchitis.  Based on hx and length of symptoms we will go ahead and treat with Z pac  for precaution and possible lower respiratory tract infection.  Tessalon pearls for cough as needed Naproxen for  pain and inflammation in the chest, most likely due from coughing.  strep test was negative, the sore throat could be from drainage or coughing.  Not likely PE, wells score at low risk.  Strict precautions that if her symptoms continue or worsen she will need to follow-up Instructed that if she starts having worsening chest pain or shortness of breath she needs to go the hospital Patient understanding agrees Final Clinical Impressions(s) / UC Diagnoses   Final diagnoses:  Sore throat  Lower respiratory tract infection     Discharge Instructions     We will go ahead and treat you today based on length of your symptoms, fever and past medical history.  I am going to give you a z pac to treat infection. Tessalon pearls for cough as needed You can take naproxen 500 mg twice a day for chest discomfort. Take this with food.  Recommend taking your allergy pill daily If your symptoms worsen or do not improve please follow up.        ED Prescriptions    Medication Sig Dispense Auth. Provider   naproxen (NAPROSYN) 500 MG tablet Take 1 tablet (500 mg total) by mouth 2 (two) times daily. 30 tablet Bartow Zylstra A, NP   azithromycin (ZITHROMAX) 250 MG tablet Take 1 tablet (250 mg total) by mouth daily. Take first 2 tablets together, then 1 every day until finished. 6 tablet Maurisa Tesmer A, NP   benzonatate (TESSALON) 100 MG capsule Take 1 capsule (100 mg total) by mouth every 8 (eight) hours. 21 capsule Durene Dodge A, NP   cetirizine (ZYRTEC) 10 MG tablet Take 1 tablet (10 mg total) by mouth daily. 30 tablet Loura Halt A, NP     Controlled Substance Prescriptions [place] Controlled Substance Registry consulted? Not Applicable   Orvan July, NP 01/18/19 1324

## 2019-01-18 NOTE — Discharge Instructions (Addendum)
We will go ahead and treat you today based on length of your symptoms, fever and past medical history.  I am going to give you a z pac to treat infection. Tessalon pearls for cough as needed You can take naproxen 500 mg twice a day for chest discomfort. Take this with food.  Recommend taking your allergy pill daily If your symptoms worsen or do not improve please follow up.

## 2019-01-18 NOTE — ED Triage Notes (Signed)
PT is taking a med for breast cancer, she has been in remission for 1.5 years.

## 2019-02-19 ENCOUNTER — Telehealth: Payer: PRIVATE HEALTH INSURANCE | Admitting: Physician Assistant

## 2019-02-19 DIAGNOSIS — M549 Dorsalgia, unspecified: Secondary | ICD-10-CM

## 2019-02-19 NOTE — Progress Notes (Signed)
Based on what you shared with me, I feel your condition warrants further evaluation and I recommend that you be seen for a face to face office visit.     NOTE: If you entered your credit card information for this eVisit, you will not be charged. You may see a "hold" on your card for the $35 but that hold will drop off and you will not have a charge processed.  If you are having a true medical emergency please call 911.  If you need an urgent face to face visit, [place] has four urgent care centers for your convenience.    PLEASE NOTE: THE [facility] LOCATIONS AND URGENT CARE CLINICS DO NOT HAVE THE TESTING FOR CORONAVIRUS COVID19 AVAILABLE.  IF YOU FEEL YOU NEED THIS TEST YOU MUST GO TO A TRIAGE LOCATION AT ONE OF THE HOSPITAL EMERGENCY DEPARTMENTS   [email or web address] to reserve your spot online an avoid wait times  [facility] [address], [address] , [place] [address] Modified hours of operation: Monday-Friday, 10 AM to 6 PM  Saturday & Sunday 10 AM to 4 PM *Across the street from [employer]  [facility] (New Address!) [address], [address] [place], [place] [address] *Just off [address], across the road from [employer]* Modified hours of operation: Monday-Friday, 10 AM to 5 PM  Closed Saturday & Sunday   The following sites will take your insurance:  . [facility]  [phone] Get Driving Directions Find a Provider at this Location  [address] , [place] [address] . 10 am to 8 pm Monday-Friday . 12 pm to 8 pm Saturday-Sunday   . [facility]  [phone] Get Driving Directions Find a Provider at this Location  [address], [address] , [place] [address] . 8 am to 8 pm Monday-Friday . 9 am to 6 pm Saturday . 11 am to 6 pm Sunday   . [facility] [place]  [phone] Get Driving Directions  [address] [place],  [address] . 8 am to 8 pm Monday-Friday . 8 am to 4 pm Saturday-Sunday   Your e-visit answers were reviewed by a board certified advanced clinical practitioner to complete your personal care plan.  Thank you for using e-Visits. 

## 2019-08-17 ENCOUNTER — Telehealth: Payer: Self-pay | Admitting: Oncology

## 2019-08-17 NOTE — Telephone Encounter (Signed)
A new patient appt has been scheduled for the pt to see Dr. Jana Hakim on 11/18 at 4pm w/labs at 3:30pm. Pt aware to arrive 15 minutes early.

## 2019-09-10 ENCOUNTER — Telehealth: Payer: Self-pay | Admitting: Oncology

## 2019-09-10 NOTE — Telephone Encounter (Signed)
Monica Black cld to reschedule her new patient appt w/Dr. Jana Hakim to 12/7 at 4pm w/labs at 3:30pm.

## 2019-09-12 ENCOUNTER — Other Ambulatory Visit: Payer: PRIVATE HEALTH INSURANCE

## 2019-09-12 ENCOUNTER — Ambulatory Visit: Payer: PRIVATE HEALTH INSURANCE | Admitting: Oncology

## 2019-09-28 ENCOUNTER — Other Ambulatory Visit: Payer: Self-pay | Admitting: *Deleted

## 2019-09-28 DIAGNOSIS — C50911 Malignant neoplasm of unspecified site of right female breast: Secondary | ICD-10-CM

## 2019-09-30 NOTE — Progress Notes (Signed)
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 She moved from [place] to [place] in 07/2017 and transferred her care to [facility] in [place], [place].   She had screening mammogram in June 2017 showing microcalcifications in the right breast. She proceeded to biopsy that month that showed by her recollection "stage 0" cancer. She tells me she had a PET scan at that time which did not show any adenopathy. 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   She was subsequently treated with chemotherapy consisting of dose dense AC x4 cycles. She then received one dose of weekly taxol, but she had an infusion reaction. She was switched to taxotere and completed 3 cycles given 21 days apart on 09/15/2016.  She opted to undergo bilateral mastectomies with left sentinel lymph node biopsy on 10/08/2016 but decided against immediate DIEP reconstruction.. Final pathology [phone]) revealed right breast multifocal residual invasive ductal carcinoma, with 2 foci measuring 0.5 cm each, grade 3; lymphovascular invasion present; margins negative. Left breast and  the single left sentinel lymph node were negative for malignancy.   She was then placed on tamoxifen, as well as Xeloda for 2 weeks on and 1 week off, in 10/2016. She received concurrent radiation therapy from 10/2016 through 12/27/2016. She completed 8 cycles of Xeloda on 04/16/2017.  Genetic testing was also performed while she was in [place], which showed no pathogenic mutations.  Although her periods never recurred, she was switched to Zoladex/letrozole on 10/11/2017. She is tolerating this well.  The patient's subsequent history is as detailed below.   INTERVAL HISTORY: Monica Black was evaluated in the breast cancer clinic on 10/01/2019.  Her most recent breast MRI was on 11/08/2018 and showed no evidence of malignancy.  Her most recent staging imaging was performed on 03/22/2019. Chest, abdomen, pelvis CT, bone scan, and brain MRI were all negative for metastatic disease.  Her most recent bone density screening was performed in 02/2018 and was considered normal. She will be due for repeat after 02/2020.   REVIEW OF SYSTEMS: Monica Black reports no specific symptoms leading to the original mammogram, which was routinely scheduled. She denies unusual headaches, visual changes, nausea, vomiting, stiff neck, dizziness, or gait imbalance. There has been no cough, phlegm production, or pleurisy, no chest pain or pressure, and no change in bowel or bladder habits. The patient denies fever, rash, bleeding, unexplained fatigue or unexplained weight loss. She feels she gained a lot of weight and still has aches and pains in various places, not persistent, secondary to the treatment she has received. She also has menopausal symptoms including hot flashes, all of these are better, and vaginal dryness. A detailed review of systems was otherwise entirely negative.   PAST MEDICAL HISTORY: Past Medical History:  Diagnosis Date   Breast  cancer [facility])     PAST SURGICAL HISTORY: Past Surgical History:  Procedure  Laterality Date   MASTECTOMY, RADICAL    Status post left elbow surgery  FAMILY HISTORY: Family History  Problem Relation Age of Onset   Asthma Father    Lung cancer Paternal Grandfather    The patient's mother was adopted and has no information regarding her biologic family. She is 38 years old as of December 2020. The patient's father is 74 years old as of December 2020. He has a history of asthma. His father had brain cancer. The patient's father had 1 brother, with Parkinson's disease, and 2 sisters, 1 of whom died at a young age. There is no history of breast or ovarian cancer on his side of the family. The patient himself has 2 brothers, aged 36 and 103 as of December 2020. She is not aware of any other cancer history in the family   GYNECOLOGIC HISTORY:  The patient thinks her last menstrual period was around October 2015. Her periods had been scant and irregular prior to that. From her marriage November 2015 through the start of chemo August 2017 (21 months) she and her husband used no contraceptives and she did not get pregnant. Menarche: 38 years old [place] P 0 LMP 04/2016 Contraceptive used for 1 year HRT n/a  Hysterectomy? no BSO? no   SOCIAL HISTORY: (updated 09/2019)  Monica Black is currently working part-time at [employer]. Her husband Aaron Edelman works for [employer] doing analysis of [employer]. They attend [employer]. They are currently fostering 2 little girls they hope to adopt.   ADVANCED DIRECTIVES: In the absence of any documentation to the contrary, the patient's spouse is their HCPOA.   HEALTH MAINTENANCE: Social History   Tobacco Use   Smoking status: Never Smoker   Smokeless tobacco: Never Used  Substance Use Topics   Alcohol use: No   Drug use: No     Colonoscopy: n/a  PAP: 04/2018, negative  Bone density: 2019?   Allergies  Allergen Reactions   Acetaminophen Itching   Penicillins Hives    Current Outpatient Medications    Medication Sig Dispense Refill   letrozole (FEMARA) 2.5 MG tablet Take 1 tablet (2.5 mg total) by mouth daily. 90 tablet 4   naproxen (NAPROSYN) 500 MG tablet Take 1 tablet (500 mg total) by mouth 2 (two) times daily. 30 tablet 0   No current facility-administered medications for this visit.     OBJECTIVE: Young white woman in no acute distress  Vitals:   10/01/19 1556  BP: 121/69  Pulse: 79  Resp: 18  Temp: (!) 97.4 F (36.3 C)  SpO2: 100%     Body mass index is 31.54 kg/m.   Wt Readings from Last 3 Encounters:  10/01/19 161 lb 8 oz (73.3 kg)  05/01/18 145 lb (65.8 kg)  03/01/18 145 lb 12 oz (66.1 kg)      ECOG FS:1 - Symptomatic but completely ambulatory  Ocular: Sclerae unicteric, EOMs intact Ear-nose-throat: Wearing a mask Lymphatic: No cervical or supraclavicular adenopathy Lungs no rales or rhonchi Heart regular rate and rhythm Abd soft, obese, nontender, positive bowel sounds MSK no focal spinal tenderness, no joint edema Neuro: non-focal, well-oriented, appropriate affect Breasts:    LAB RESULTS:  CMP     Component Value Date/Time   NA 141 10/01/2019 1530   K 3.8 10/01/2019 1530   CL 105 10/01/2019 1530   CO2 27 10/01/2019 1530   GLUCOSE 126 (H)  10/01/2019 1530   BUN 11 10/01/2019 1530   CREATININE 0.90 10/01/2019 1530   CALCIUM 9.2 10/01/2019 1530   PROT 7.6 10/01/2019 1530   ALBUMIN 4.2 10/01/2019 1530   AST 20 10/01/2019 1530   ALT 20 10/01/2019 1530   ALKPHOS 118 10/01/2019 1530   BILITOT 0.4 10/01/2019 1530   GFRNONAA >60 10/01/2019 1530   GFRAA >60 10/01/2019 1530    No results found for: TOTALPROTELP, ALBUMINELP, A1GS, A2GS, BETS, BETA2SER, GAMS, MSPIKE, SPEI  No results found for: KPAFRELGTCHN, LAMBDASER, KAPLAMBRATIO  Lab Results  Component Value Date   WBC 7.8 10/01/2019   NEUTROABS 4.6 10/01/2019   HGB 13.3 10/01/2019   HCT 38.9 10/01/2019   MCV 85.1 10/01/2019   PLT 366 10/01/2019    @LASTCHEMISTRY @  No results found  for: LABCA2  No components found for: [id number]  No results for input(s): INR in the last 168 hours.  No results found for: LABCA2  No results found for: BJS283  No results found for: TDV761  No results found for: YWV371  No results found for: CA2729  No components found for: HGQUANT  No results found for: CEA1 / No results found for: CEA1   No results found for: AFPTUMOR  No results found for: CHROMOGRNA  No results found for: PSA1  Appointment on 10/01/2019  Component Date Value Ref Range Status   Sodium 10/01/2019 141  135 - 145 mmol/L Final   Potassium 10/01/2019 3.8  3.5 - 5.1 mmol/L Final   Chloride 10/01/2019 105  98 - 111 mmol/L Final   CO2 10/01/2019 27  22 - 32 mmol/L Final   Glucose, Bld 10/01/2019 126* 70 - 99 mg/dL Final   BUN 10/01/2019 11  6 - 20 mg/dL Final   Creatinine 10/01/2019 0.90  0.44 - 1.00 mg/dL Final   Calcium 10/01/2019 9.2  8.9 - 10.3 mg/dL Final   Total Protein 10/01/2019 7.6  6.5 - 8.1 g/dL Final   Albumin 10/01/2019 4.2  3.5 - 5.0 g/dL Final   AST 10/01/2019 20  15 - 41 U/L Final   ALT 10/01/2019 20  0 - 44 U/L Final   Alkaline Phosphatase 10/01/2019 118  38 - 126 U/L Final   Total Bilirubin 10/01/2019 0.4  0.3 - 1.2 mg/dL Final   GFR, Est Non Af Am 10/01/2019 >60  >60 mL/min Final   GFR, Est AFR Am 10/01/2019 >60  >60 mL/min Final   Anion gap 10/01/2019 9  5 - 15 Final   Performed at [facility], [place] [address] [place], [place] [address]   WBC Count 10/01/2019 7.8  4.0 - 10.5 K/uL Final   RBC 10/01/2019 4.57  3.87 - 5.11 MIL/uL Final   Hemoglobin 10/01/2019 13.3  12.0 - 15.0 g/dL Final   HCT 10/01/2019 38.9  36.0 - 46.0 % Final   MCV 10/01/2019 85.1  80.0 - 100.0 fL Final   MCH 10/01/2019 29.1  26.0 - 34.0 pg Final   MCHC 10/01/2019 34.2  30.0 - 36.0 g/dL Final   RDW 10/01/2019 12.2  11.5 - 15.5 % Final   Platelet Count 10/01/2019 366  150 - 400 K/uL Final   nRBC 10/01/2019 0.0   0.0 - 0.2 % Final   Neutrophils Relative % 10/01/2019 58  % Final   Neutro Abs 10/01/2019 4.6  1.7 - 7.7 K/uL Final   Lymphocytes Relative 10/01/2019 31  % Final   Lymphs Abs 10/01/2019 2.4  0.7 - 4.0 K/uL Final  Monocytes Relative 10/01/2019 8  % Final   Monocytes Absolute 10/01/2019 0.6  0.1 - 1.0 K/uL Final   Eosinophils Relative 10/01/2019 2  % Final   Eosinophils Absolute 10/01/2019 0.1  0.0 - 0.5 K/uL Final   Basophils Relative 10/01/2019 1  % Final   Basophils Absolute 10/01/2019 0.1  0.0 - 0.1 K/uL Final   Immature Granulocytes 10/01/2019 0  % Final   Abs Immature Granulocytes 10/01/2019 0.01  0.00 - 0.07 K/uL Final   Performed at [facility], [address] [place], [place] [address]    (this displays the last labs from the last 3 days)  No results found for: TOTALPROTELP, ALBUMINELP, A1GS, A2GS, BETS, BETA2SER, GAMS, MSPIKE, SPEI (this displays SPEP labs)  No results found for: KPAFRELGTCHN, LAMBDASER, KAPLAMBRATIO (kappa/lambda light chains)  No results found for: HGBA, HGBA2QUANT, HGBFQUANT, HGBSQUAN (Hemoglobinopathy evaluation)   No results found for: LDH  No results found for: IRON, TIBC, IRONPCTSAT (Iron and TIBC)  No results found for: FERRITIN  Urinalysis    Component Value Date/Time   COLORURINE STRAW (A) 05/01/2018 2012   APPEARANCEUR CLEAR (A) 05/01/2018 2012   LABSPEC 1.003 (L) 05/01/2018 2012   PHURINE 6.0 05/01/2018 2012   [place] NEGATIVE 05/01/2018 2012   HGBUR SMALL (A) 05/01/2018 2012   [place] NEGATIVE 05/01/2018 2012   [place] NEGATIVE 05/01/2018 2012   [place] NEGATIVE 05/01/2018 2012   NITRITE NEGATIVE 05/01/2018 2012   LEUKOCYTESUR SMALL (A) 05/01/2018 2012     STUDIES: No results found.  ELIGIBLE FOR AVAILABLE RESEARCH PROTOCOL: no  ASSESSMENT: 38 y.o. [place], [place] woman status post right breast overlapping sites lumpectomy and sentinel lymph node biopsy June 2017 (and subsequent  axillary lymph node dissection 05/18/2016) showing a pT3 pN2, stage IIIA invasive ductal carcinoma, grade 3, estrogen and progesterone receptor positive, HER-2 not amplified.  (a) a total of 21 axillary lymph nodes removed, 6 of them positive  (1) [neo]adjuvant chemotherapy consisted of dose dense cyclophosphamide and doxorubicin x4 followed by paclitaxel weekly x1, with the paclitaxel discontinued because of an infusion reaction  (a) docetaxel x3 Q 21 days completed 09/15/2016.  (2) status post bilateral mastectomies and left sentinel lymph node sampling 10/08/2016 showing  (a) on the left no evidence of malignancy; single sentinel lymph node removed  (b) on the right, residual multifocal invasive ductal carcinoma, mpT1a, grade 3, with positive lymphovascular invasion but negative margins  (c) repeat prognostic panel: Estrogen receptor 100% positive, progesterone receptor 50% positive, HER-2 not amplified, MIB-1 was 20%  (3) adjuvant radiation given between January 2018 and 12/27/2016  (4) adjuvant capecitabine (2 weeks on, 1 week off) started January 2018 and completed 8 cycles 04/16/2017  (5) tamoxifen started January 2018  (a) changed to letrozole/goserelin January 2019  (b) baseline DEXA scan May 2019 normal  (6) negative genetics testing through [facility] gene panel  (7) considering bilateral salpingo-oophorectomy  (8) considering denosumab or zoledronate  PLAN: I spent approximately 60 minutes face to face with Monica Black with more than 50% of that time spent in counseling and coordination of care. Specifically we reviewed the biology of her breast cancer diagnosis and the specifics of her situation. I reassured her that she has been treated appropriately.  I reviewed the notes on her prior care and confirmed them with her, giving her a copy of the summary.  She is currently receiving goserelin and letrozole. She understands this is based on the soft and text trials, which  showed that young  women with invasive breast cancer who received chemotherapy and resume menstruation after chemo did best with this combination. Of course she did not resume menstruation and in fact had had no periods for more than a year prior to the start of chemo. Nevertheless I would vote to continue her current treatments until she makes a definitive decision regarding bilateral salpingo-oophorectomy.   She would like to receive the goserelin here which would be closer for her than traveling to [place] and if we can arrange for that, which of course we can, she would prefer to change her care to here for the same reason.  We had a very long discussion regarding pregnancy. They had unprotected intercourse for 21 months prior to the start of chemo with no pregnancy. She had very irregular periods and scant prior to her marriage November 2015. She was told by a gynecologist in [place] that she probably could never get pregnant. So the chance of her getting pregnant even if she went completely off treatment is very low.  In addition, she tells me she and her husband are happy with the prospect of adoption. They are already fostering 2 little girls that they would love to be able to keep.  Given all this I think it would be very reasonable for her to proceed to bilateral salpingo-oophorectomy. I will ask our gynecology oncology colleagues to evaluate her for this. If she does undergo bilateral salpingo-oophorectomy then we would continue the letrozole for a total of 7 years on that medication. After that we would consider other options.  Monica Black has a good understanding of the overall plan. She agrees with it. She knows the goal of treatment in her case is cure. She will call with any problems that may develop before her next visit here.   Chauncey Cruel, MD   10/01/2019 5:04 PM Medical Oncology and Hematology [facility] [place], [place] [address] Tel. [phone]     Fax. [phone]   This document serves as a record of services personally performed by Lurline Del, MD. It was created on his behalf by Wilburn Mylar, a trained medical scribe. The creation of this record is based on the scribe's personal observations and the provider's statements to them.   I, Lurline Del MD, have reviewed the above documentation for accuracy and completeness, and I agree with the above.

## 2019-10-01 ENCOUNTER — Inpatient Hospital Stay (HOSPITAL_BASED_OUTPATIENT_CLINIC_OR_DEPARTMENT_OTHER): Payer: Commercial Managed Care - PPO | Admitting: Oncology

## 2019-10-01 ENCOUNTER — Other Ambulatory Visit: Payer: Self-pay

## 2019-10-01 ENCOUNTER — Inpatient Hospital Stay: Payer: Commercial Managed Care - PPO | Attending: Oncology

## 2019-10-01 DIAGNOSIS — Z801 Family history of malignant neoplasm of trachea, bronchus and lung: Secondary | ICD-10-CM

## 2019-10-01 DIAGNOSIS — C773 Secondary and unspecified malignant neoplasm of axilla and upper limb lymph nodes: Secondary | ICD-10-CM

## 2019-10-01 DIAGNOSIS — Z808 Family history of malignant neoplasm of other organs or systems: Secondary | ICD-10-CM | POA: Diagnosis not present

## 2019-10-01 DIAGNOSIS — C50811 Malignant neoplasm of overlapping sites of right female breast: Secondary | ICD-10-CM | POA: Insufficient documentation

## 2019-10-01 DIAGNOSIS — N898 Other specified noninflammatory disorders of vagina: Secondary | ICD-10-CM | POA: Diagnosis not present

## 2019-10-01 DIAGNOSIS — Z7189 Other specified counseling: Secondary | ICD-10-CM | POA: Insufficient documentation

## 2019-10-01 DIAGNOSIS — Z79818 Long term (current) use of other agents affecting estrogen receptors and estrogen levels: Secondary | ICD-10-CM | POA: Insufficient documentation

## 2019-10-01 DIAGNOSIS — Z79811 Long term (current) use of aromatase inhibitors: Secondary | ICD-10-CM | POA: Diagnosis not present

## 2019-10-01 DIAGNOSIS — N951 Menopausal and female climacteric states: Secondary | ICD-10-CM

## 2019-10-01 DIAGNOSIS — Z9013 Acquired absence of bilateral breasts and nipples: Secondary | ICD-10-CM | POA: Diagnosis not present

## 2019-10-01 DIAGNOSIS — Z170 Estrogen receptor positive status [ER+]: Secondary | ICD-10-CM | POA: Insufficient documentation

## 2019-10-01 DIAGNOSIS — Z853 Personal history of malignant neoplasm of breast: Secondary | ICD-10-CM | POA: Insufficient documentation

## 2019-10-01 DIAGNOSIS — C50911 Malignant neoplasm of unspecified site of right female breast: Secondary | ICD-10-CM

## 2019-10-01 LAB — CMP (CANCER CENTER ONLY)
ALT: 20 U/L (ref 0–44)
AST: 20 U/L (ref 15–41)
Albumin: 4.2 g/dL (ref 3.5–5.0)
Alkaline Phosphatase: 118 U/L (ref 38–126)
Anion gap: 9 (ref 5–15)
BUN: 11 mg/dL (ref 6–20)
CO2: 27 mmol/L (ref 22–32)
Calcium: 9.2 mg/dL (ref 8.9–10.3)
Chloride: 105 mmol/L (ref 98–111)
Creatinine: 0.9 mg/dL (ref 0.44–1.00)
GFR, Est AFR Am: >60 mL/min (ref >60)
GFR, Estimated: >60 mL/min (ref >60)
Glucose, Bld: 126 mg/dL — ABNORMAL HIGH (ref 70–99)
Potassium: 3.8 mmol/L (ref 3.5–5.1)
Sodium: 141 mmol/L (ref 135–145)
Total Bilirubin: 0.4 mg/dL (ref 0.3–1.2)
Total Protein: 7.6 g/dL (ref 6.5–8.1)

## 2019-10-01 LAB — CBC WITH DIFFERENTIAL (CANCER CENTER ONLY)
Abs Immature Granulocytes: 0.01 10*3/uL (ref 0.00–0.07)
Basophils Absolute: 0.1 10*3/uL (ref 0.0–0.1)
Basophils Relative: 1 %
Eosinophils Absolute: 0.1 10*3/uL (ref 0.0–0.5)
Eosinophils Relative: 2 %
HCT: 38.9 % (ref 36.0–46.0)
Hemoglobin: 13.3 g/dL (ref 12.0–15.0)
Immature Granulocytes: 0 %
Lymphocytes Relative: 31 %
Lymphs Abs: 2.4 10*3/uL (ref 0.7–4.0)
MCH: 29.1 pg (ref 26.0–34.0)
MCHC: 34.2 g/dL (ref 30.0–36.0)
MCV: 85.1 fL (ref 80.0–100.0)
Monocytes Absolute: 0.6 10*3/uL (ref 0.1–1.0)
Monocytes Relative: 8 %
Neutro Abs: 4.6 10*3/uL (ref 1.7–7.7)
Neutrophils Relative %: 58 %
Platelet Count: 366 10*3/uL (ref 150–400)
RBC: 4.57 MIL/uL (ref 3.87–5.11)
RDW: 12.2 % (ref 11.5–15.5)
WBC Count: 7.8 10*3/uL (ref 4.0–10.5)
nRBC: 0 % (ref 0.0–0.2)

## 2019-10-01 MED ORDER — LETROZOLE 2.5 MG PO TABS: 2.5000 mg | ORAL_TABLET | Freq: Every day | ORAL | 4 refills | Status: DC

## 2019-10-03 ENCOUNTER — Telehealth: Payer: Self-pay | Admitting: Oncology

## 2019-10-03 NOTE — Telephone Encounter (Signed)
I talk with patient regarding schedule  

## 2019-10-05 ENCOUNTER — Telehealth: Payer: Self-pay | Admitting: *Deleted

## 2019-10-05 NOTE — Telephone Encounter (Signed)
Called and left the patient a message to call the office back. Need to schedule the patient for a new appt

## 2019-10-08 ENCOUNTER — Inpatient Hospital Stay: Payer: Commercial Managed Care - PPO

## 2019-10-08 ENCOUNTER — Encounter: Payer: Self-pay | Admitting: Gynecologic Oncology

## 2019-10-08 ENCOUNTER — Telehealth: Payer: Self-pay | Admitting: *Deleted

## 2019-10-08 NOTE — Progress Notes (Signed)
GYNECOLOGIC ONCOLOGY NEW PATIENT CONSULTATION   Patient Name: Monica Black  Patient Age: 38 y.o. Date of Service: 08/09/19 Referring Provider: Pleas Koch, NP [place],  [place] [address]   Primary Care Provider: Pleas Koch, NP Consulting Provider: Jeral Pinch, MD   Assessment/Plan:  [phone] with history of locally-advanced ER+ breast cancer currently on goserelin and letrozole.  I had a long discussion with Monica Black regarding the rationale for therapeutic removal of the ovaries. She has an estrogen receptor positive breast cancer and she has been on chemical ovarian suppression. The two largest trials (SOFT and TEXT) found a prolonged disease-free survival benefit in "higher risk" patients when ovulatory suppression was added to standard endocrine therapy (e.g. tamoxifen, AI).  Populations at "higher risk" for recurrence are usually young (<40), >4 positive LN, tumor grade 2-3.   I discussed that ovarian ablation can be permanent (oophorectomy) or reversible (with a GnRH agonists, such as what she is on).  While there is limited high-quality evidence, some have proposed that oophorectomy be gold standard ablative therapy.  Patient is aware that oophorectomy (or long-term GnRH use) could increase her risk for osteoporosis, cardiovascular disease, hot flashes, and depression/anxiety.   She notes that her medical oncologist has recommended that she stay on letrozole if she proceeds with BSO.  In that case, we discussed that there would not be a strong indication for concurrent hysterectomy.  It is possible that the patient is menopausal, though we discussed the difficulty with measuring hormone levels while on a GnRH agonist and letrozole. In the setting of Covid in the holidays, the patient would prefer to see me in several months to discuss again the possibility of scheduling oophorectomy.  She will continue with her monthly injections for ovarian suppression at  this time.  A copy of this note was sent to the patient's referring provider.   Jeral Pinch, MD  Division of Gynecologic Oncology  Department of Obstetrics and Gynecology  [facility]  ___________________________________________  Chief Complaint: Chief Complaint  Patient presents with  . Malignant neoplasm of overlapping sites of right breast in f    History of Present Illness:  Monica Black is a 38 y.o. y.o. female who is seen in consultation at the request of Pleas Koch, NP for an evaluation of BSO in the setting of ER+ breast cancer.   The patient was initially diagnosed in 2007, after a biopsy was performed for microcalcifications in her right breast on a mammogram in June.  She underwent a right lumpectomy and sentinel lymph node biopsy in July 2017 which revealed invasive ductal carcinoma, ER/PR+, HER2 -. She was treated with chemotherapy (dose dense AC x4 cycles, followed by 4 cycles of a taxane  -reacted to Taxol with first treatment and subsequent 3 cycles were with Taxotere).  He completed this chemotherapy on 09/15/2016.  She then underwent bilateral mastectomies with left sentinel lymph node biopsy in December 2017.  Multifocal residual and base of ductal carcinoma was noted on final pathology from the right breast.  The left breast and left sentinel lymph node were negative for malignancy.  The patient was then started on tamoxifen and Xeloda (2 weeks on 1 week off for 8 cycles, completed 03/2017) in January 2018.  She underwent concurrent radiation which was completed in March 2018.  Genetic testing was also performed while she was in [place], which showed no pathogenic mutations.   The patient reports that overall she is  doing well.  She was transitioned to Zoladex and letrozole in December 2018.  She notes that in 2016 her menses began to be very sporadic and light, often lasting only 1 day.  Her last menstrual bleeding was in July 2017 on  her day of surgery.  She had hormone testing in 2016 or 17 and was told that this was normal and that she was not in menopause or premenopausal.  In terms of the Goserelin, she has some pain at the injection site.  Otherwise, since completing treatment in 2018, she notes some arthritis as well as continued weight gain (which had started before breast cancer and was one of the symptoms that ultimately led to her diagnosis).  She has some vaginal dryness as well.  PAST MEDICAL HISTORY:  Past Medical History:  Diagnosis Date  . Breast cancer ([place])      PAST SURGICAL HISTORY:  Past Surgical History:  Procedure Laterality Date  . BREAST LUMPECTOMY Right   . MASTECTOMY, RADICAL      OB/GYN HISTORY:  OB History  Gravida Para Term Preterm AB Living  0 0 0 0 0 0  SAB TAB Ectopic Multiple Live Births  0 0 0 0 0  Obstetric Comments  LMP: 07/2014    No LMP recorded.  Age at menarche: 73  Hx of STDs: denies Last pap: 04/2018 - negative History of abnormal pap smears: Reports history of an abnormal Pap several years ago that was followed with colposcopy.  Biopsy resulted showing inflammation.  Repeat Pap test several months later was normal.  SCREENING STUDIES:  Last mammogram: last breast MRI - 10/2018 Last colonoscopy: n/a Last bone mineral density: 02/2018 - normal  MEDICATIONS: Outpatient Encounter Medications as of 10/09/2019  Medication Sig  . letrozole (FEMARA) 2.5 MG tablet Take 1 tablet (2.5 mg total) by mouth daily.  . naproxen (NAPROSYN) 500 MG tablet Take 1 tablet (500 mg total) by mouth 2 (two) times daily.  . [EXPIRED] goserelin (ZOLADEX) injection 3.6 mg    No facility-administered encounter medications on file as of 10/09/2019.    ALLERGIES:  Allergies  Allergen Reactions  . Acetaminophen Itching  . Penicillins Hives     FAMILY HISTORY:  Family History  Problem Relation Age of Onset  . Other Mother        adopted, family history unk  . Asthma Father   .  Brain cancer Paternal Grandfather   . Parkinson's disease Paternal Uncle   . Breast cancer Neg Hx   . Ovarian cancer Neg Hx      SOCIAL HISTORY:    Social Connections:   . Frequency of Communication with Friends and Family: Not on file  . Frequency of Social Gatherings with Friends and Family: Not on file  . Attends Religious Services: Not on file  . Active Member of Clubs or Organizations: Not on file  . Attends Archivist Meetings: Not on file  . Marital Status: Not on file    REVIEW OF SYSTEMS:  Denies appetite changes, fevers, chills, fatigue, unexplained weight changes. Denies hearing loss, neck lumps or masses, mouth sores, ringing in ears or voice changes. Denies cough or wheezing.  Denies shortness of breath. Denies chest pain or palpitations. Denies leg swelling. Denies abdominal distention, pain, blood in stools, constipation, diarrhea, nausea, vomiting, or early satiety. Denies pain with intercourse, dysuria, frequency, hematuria or incontinence. Denies hot flashes, pelvic pain, vaginal bleeding or vaginal discharge.   Denies joint pain, back pain or muscle  pain/cramps. Denies itching, rash, or wounds. Denies dizziness, headaches, numbness or seizures. Denies swollen lymph nodes or glands, denies easy bruising or bleeding. Denies anxiety, depression, confusion, or decreased concentration.    Physical Exam:  Vital Signs for this encounter:  Blood pressure 126/81, pulse 65, respiratory rate 18, temperature 36.9 C, oxygen saturation 100% on room air General: Alert, oriented, no acute distress.  HEENT: Normocephalic, atraumatic. Sclera anicteric.  Chest: Clear to auscultation bilaterally.  No wheezes rhonchi or rales. Cardiovascular: Regular rate and rhythm, no murmurs, rubs, or gallops.  Extremities: Grossly normal range of motion. Warm, well perfused. No edema bilaterally.  Skin: No rashes or lesions.  Lymphatics: No cervical, supraclavicular adenopathy.   GU:  Deferred  LABORATORY AND RADIOLOGIC DATA:   CBC    Component Value Date/Time   WBC 7.8 10/01/2019 1530   WBC 3.9 (L) 10/21/2018 1010   RBC 4.57 10/01/2019 1530   HGB 13.3 10/01/2019 1530   HCT 38.9 10/01/2019 1530   PLT 366 10/01/2019 1530   MCV 85.1 10/01/2019 1530   MCH 29.1 10/01/2019 1530   MCHC 34.2 10/01/2019 1530   RDW 12.2 10/01/2019 1530   LYMPHSABS 2.4 10/01/2019 1530   MONOABS 0.6 10/01/2019 1530   EOSABS 0.1 10/01/2019 1530   BASOSABS 0.1 10/01/2019 1530   CMP Latest Ref Rng & Units 10/01/2019 10/21/2018 05/01/2018  Glucose 70 - 99 mg/dL 126(H) 107(H) 113(H)  BUN 6 - 20 mg/dL 11 12 15   Creatinine 0.44 - 1.00 mg/dL 0.90 0.70 0.75  Sodium 135 - 145 mmol/L 141 140 140  Potassium 3.5 - 5.1 mmol/L 3.8 5.0 3.8  Chloride 98 - 111 mmol/L 105 105 104  CO2 22 - 32 mmol/L 27 26 26   Calcium 8.9 - 10.3 mg/dL 9.2 9.5 9.4  Total Protein 6.5 - 8.1 g/dL 7.6 7.0 7.7  Total Bilirubin 0.3 - 1.2 mg/dL 0.4 1.3(H) 0.5  Alkaline Phos 38 - 126 U/L 118 86 89  AST 15 - 41 U/L 20 39 31  ALT 0 - 44 U/L 20 15 30

## 2019-10-08 NOTE — Telephone Encounter (Signed)
Patient returned call this morning and scheduled a new patient appt for tomorrow

## 2019-10-09 ENCOUNTER — Encounter: Payer: Self-pay | Admitting: Gynecologic Oncology

## 2019-10-09 ENCOUNTER — Inpatient Hospital Stay (HOSPITAL_BASED_OUTPATIENT_CLINIC_OR_DEPARTMENT_OTHER): Payer: Commercial Managed Care - PPO | Admitting: Gynecologic Oncology

## 2019-10-09 ENCOUNTER — Other Ambulatory Visit: Payer: Self-pay

## 2019-10-09 ENCOUNTER — Inpatient Hospital Stay: Payer: Commercial Managed Care - PPO

## 2019-10-09 VITALS — BP 126/81 | HR 65 | Temp 98.5°F | Resp 18

## 2019-10-09 DIAGNOSIS — C50811 Malignant neoplasm of overlapping sites of right female breast: Secondary | ICD-10-CM

## 2019-10-09 DIAGNOSIS — Z170 Estrogen receptor positive status [ER+]: Secondary | ICD-10-CM

## 2019-10-09 DIAGNOSIS — Z7189 Other specified counseling: Secondary | ICD-10-CM

## 2019-10-09 MED ORDER — GOSERELIN ACETATE 3.6 MG ~~LOC~~ IMPL
3.6000 mg | DRUG_IMPLANT | Freq: Once | SUBCUTANEOUS | Status: AC
  Administered 2019-10-09: 3.6 mg via SUBCUTANEOUS

## 2019-10-09 MED ORDER — GOSERELIN ACETATE 3.6 MG ~~LOC~~ IMPL
DRUG_IMPLANT | SUBCUTANEOUS | Status: AC
  Filled 2019-10-09: qty 3.6

## 2019-10-09 NOTE — Patient Instructions (Signed)
It was a pleasure meeting you today.  I will plan to see you on March 9 at the end of the day after your injection and visit with medical oncology.  As we discussed, you can either continue the injections for ovarian suppression or proceed with surgical removal.  If you decide prior to your visit with me in March that you would like to go ahead and get surgery scheduled to remove the tubes and ovaries, please call the clinic at [phone].  Otherwise we will discuss further in March.

## 2019-10-09 NOTE — Patient Instructions (Signed)

## 2019-10-09 NOTE — Progress Notes (Signed)
Per Juliann Pulse, okay to proceed with goserelin today in setting of pending PA.   Demetrius Charity, PharmD, [facility] Pharmacist Pharmacy Phone: [phone] 10/09/2019

## 2019-10-10 ENCOUNTER — Other Ambulatory Visit: Payer: Self-pay | Admitting: Oncology

## 2019-10-10 NOTE — Progress Notes (Signed)
Monica Mosses, MD  Flossie Wexler, Virgie Dad, MD  Hi Dr. Jana Hakim,  Thanks for sending this patient to see me. We discussed risks and benefits of salpingo-oophorectomy. At this point, given the holidays and Covid, the patient prefers to continue with the injections and I will plan to see her in March after her visit with you to discuss possible planning for surgery if she is ready at that time.  Thanks!  Wendelyn Breslow

## 2019-10-31 ENCOUNTER — Telehealth: Payer: Self-pay | Admitting: Oncology

## 2019-10-31 NOTE — Telephone Encounter (Signed)
Returned patient's phone call regarding rescheduling an appointment, per patient's request January appointment time has been rescheduled.

## 2019-11-06 ENCOUNTER — Other Ambulatory Visit: Payer: Self-pay

## 2019-11-06 ENCOUNTER — Inpatient Hospital Stay: Payer: Commercial Managed Care - PPO | Attending: Oncology

## 2019-11-06 ENCOUNTER — Ambulatory Visit: Payer: Commercial Managed Care - PPO

## 2019-11-06 VITALS — BP 132/68 | HR 74 | Temp 97.8°F | Resp 18

## 2019-11-06 DIAGNOSIS — Z5111 Encounter for antineoplastic chemotherapy: Secondary | ICD-10-CM | POA: Diagnosis not present

## 2019-11-06 DIAGNOSIS — C773 Secondary and unspecified malignant neoplasm of axilla and upper limb lymph nodes: Secondary | ICD-10-CM | POA: Insufficient documentation

## 2019-11-06 DIAGNOSIS — C50811 Malignant neoplasm of overlapping sites of right female breast: Secondary | ICD-10-CM | POA: Diagnosis present

## 2019-11-06 MED ORDER — GOSERELIN ACETATE 3.6 MG ~~LOC~~ IMPL
DRUG_IMPLANT | SUBCUTANEOUS | Status: AC
  Filled 2019-11-06: qty 3.6

## 2019-11-06 MED ORDER — GOSERELIN ACETATE 3.6 MG ~~LOC~~ IMPL
3.6000 mg | DRUG_IMPLANT | Freq: Once | SUBCUTANEOUS | Status: AC
  Administered 2019-11-06: 14:00:00 3.6 mg via SUBCUTANEOUS

## 2019-11-06 NOTE — Patient Instructions (Signed)

## 2019-12-04 ENCOUNTER — Other Ambulatory Visit: Payer: Self-pay

## 2019-12-04 ENCOUNTER — Inpatient Hospital Stay: Payer: Commercial Managed Care - PPO | Attending: Oncology

## 2019-12-04 ENCOUNTER — Encounter: Payer: Self-pay | Admitting: Oncology

## 2019-12-04 VITALS — BP 119/73 | HR 70 | Temp 98.0°F | Resp 20

## 2019-12-04 DIAGNOSIS — C773 Secondary and unspecified malignant neoplasm of axilla and upper limb lymph nodes: Secondary | ICD-10-CM | POA: Insufficient documentation

## 2019-12-04 DIAGNOSIS — C50811 Malignant neoplasm of overlapping sites of right female breast: Secondary | ICD-10-CM | POA: Diagnosis present

## 2019-12-04 DIAGNOSIS — Z5111 Encounter for antineoplastic chemotherapy: Secondary | ICD-10-CM | POA: Insufficient documentation

## 2019-12-04 MED ORDER — GOSERELIN ACETATE 3.6 MG ~~LOC~~ IMPL
DRUG_IMPLANT | SUBCUTANEOUS | Status: AC
  Filled 2019-12-04: qty 3.6

## 2019-12-04 MED ORDER — GOSERELIN ACETATE 3.6 MG ~~LOC~~ IMPL
3.6000 mg | DRUG_IMPLANT | Freq: Once | SUBCUTANEOUS | Status: AC
  Administered 2019-12-04: 10:00:00 3.6 mg via SUBCUTANEOUS

## 2019-12-04 NOTE — Progress Notes (Signed)
Met w/ pt to introduce myself as her Arboriculturist.  Unfortunately there aren't any foundations offering copay assistance for her Dx.  I offered the [employer], went over what it covers, gave her an expense sheet and the income requirement.  She stated her household income exceeds the requirement so she doesn't qualify for the grant at this time.  She has my card in case her situation changes and would like to apply for the grant at a later time and for any questions or concerns she may have in the future.

## 2019-12-04 NOTE — Patient Instructions (Signed)

## 2019-12-31 NOTE — Progress Notes (Signed)
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 She was evaluated in the breast cancer clinic on 10/01/2019.  Since her last visit, she met with Dr. Berline Lopes in GYN oncology on 10/10/2019 to discuss BSO. At that time, they opted to wait until after the holidays and until the pandemic has lessened. She has an appointment to meet with Dr. Berline Lopes again later today.  Her most recent breast MRI was on 11/08/2018 and showed no evidence of malignancy.  Her most recent staging imaging was performed on 03/22/2019. Chest, abdomen, pelvis CT, bone scan, and brain MRI were all negative for metastatic disease.  Her most recent bone density screening was performed in 02/2018 and was considered normal. She will be due for repeat after 02/2020.  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  She would like to go off treatment altogether but of course is concerned about the risk of disease recurrence.  Her hair is thinning.  That really is the main issue she is having except that she hates the shots which are painful.  She is not exercising regularly but she thinks she walks about 2 miles a day at work.  She and her husband are continuing to go through  legal channels to see if they can adopt the 2 girls they are fostering.  She unfortunately thinks the court is not going to rule in their favor.   HISTORY OF CURRENT ILLNESS: From the original intake note:  Monica Black has a history of breast cancer dating back to 2017. She moved from [place] to [place] in 07/2017 and transferred her care to [facility] in [place], [place].   She had screening mammogram in June 2017 showing microcalcifications in the right breast. She proceeded to biopsy that month that showed by her recollection "stage 0" cancer. She tells me she had a PET scan at that time which did not show any adenopathy. 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   She was subsequently treated with chemotherapy consisting of dose dense AC x4 cycles. She then received one dose of weekly taxol, but she had an infusion reaction. She was switched to taxotere and completed 3 cycles given 21 days apart on 09/15/2016.  She opted to undergo bilateral mastectomies with left sentinel lymph node biopsy on 10/08/2016 but decided against immediate DIEP reconstruction.. Final pathology [id number]) revealed right breast multifocal residual invasive ductal carcinoma, with 2 foci measuring 0.5 cm each, grade 3; lymphovascular invasion present; margins negative. Left breast and the single left sentinel lymph node were negative for malignancy.   She was then  placed on tamoxifen, as well as Xeloda for 2 weeks on and 1 week off, in 10/2016. She received concurrent radiation therapy from 10/2016 through 12/27/2016. She completed 8 cycles of Xeloda on 04/16/2017.  Genetic testing was also performed while she was in  [place], which showed no pathogenic mutations.  Although her periods never recurred, she was switched to Zoladex/letrozole on 10/11/2017. She is tolerating this well.  The patient's subsequent history is as detailed below.   PAST MEDICAL HISTORY: Past Medical History:  Diagnosis Date  . Breast cancer ([place])     PAST SURGICAL HISTORY: Past Surgical History:  Procedure Laterality Date  . BREAST LUMPECTOMY Right   . MASTECTOMY, RADICAL    Status post left elbow surgery   FAMILY HISTORY: Family History  Problem Relation Age of Onset  . Other Mother        adopted, family history unk  . Asthma Father   . Brain cancer Paternal Grandfather   . Parkinson's disease Paternal Uncle   . Breast cancer Neg Hx   . Ovarian cancer Neg Hx    The patient's mother was adopted and has no information regarding her biologic family. She is 39 years old as of December 2020. The patient's father is 68 years old as of December 2020. He has a history of asthma. His father had brain cancer. The patient's father had 1 brother, with Parkinson's disease, and 2 sisters, 1 of whom died at a young age. There is no history of breast or ovarian cancer on his side of the family. The patient himself has 2 brothers, aged 4 and 59 as of December 2020. She is not aware of any other cancer history in the family   GYNECOLOGIC HISTORY:  The patient thinks her last menstrual period was around October 2015. Her periods had been scant and irregular prior to that. From her marriage November 2015 through the start of chemo August 2017 (21 months) she and her husband used no contraceptives and she did not get pregnant. Menarche: 39 years old [place] P 0 LMP 04/2016 Contraceptive used for 1 year HRT n/a  Hysterectomy? no BSO? no   SOCIAL HISTORY: (updated 09/2019)  Monica Black is currently working part-time at [employer]. Her husband Aaron Edelman works for [employer] doing analysis of [employer]. They attend [employer]. They are currently fostering 2 little girls they hope to adopt.    ADVANCED DIRECTIVES: In the absence of any documentation to the contrary, the patient's spouse is their HCPOA.   HEALTH MAINTENANCE: Social History   Tobacco Use  . Smoking status: Never Smoker  . Smokeless tobacco: Never Used  Substance Use Topics  . Alcohol use: No  . Drug use: No     Colonoscopy: n/a  PAP: 04/2018, negative  Bone density: 2019?   Allergies  Allergen Reactions  . Acetaminophen Itching  . Penicillins Hives    Current Outpatient Medications  Medication Sig Dispense Refill  . naproxen (NAPROSYN) 500 MG tablet Take 1 tablet (500 mg total) by mouth 2 (two) times daily. 30 tablet 0  . tamoxifen (NOLVADEX) 20 MG tablet Take 1 tablet (20 mg total) by mouth daily. 90 tablet 12   No current facility-administered medications for this visit.    OBJECTIVE: Young white woman in no acute distress  Vitals:   01/01/20 0928  BP: 112/72  Pulse: 65  Resp: 18  Temp: 98.3 F (36.8 C)  SpO2: 100%     Body mass index is 31.15 kg/m.  Wt Readings from Last 3 Encounters:  01/01/20 159 lb 8 oz (72.3 kg)  10/01/19 161 lb 8 oz (73.3 kg)  05/01/18 145 lb (65.8 kg)      ECOG FS:1 - Symptomatic but completely ambulatory  Sclerae unicteric, EOMs intact Wearing a mask No cervical or supraclavicular adenopathy Lungs no rales or rhonchi Heart regular rate and rhythm Abd soft, nontender, positive bowel sounds MSK no focal spinal tenderness, no upper extremity lymphedema Neuro: nonfocal, well oriented, appropriate affect Breasts: Status post bilateral mastectomies.  No evidence of chest wall recurrence.  Both axillae are benign.   LAB RESULTS:  CMP     Component Value Date/Time   NA 141 01/01/2020 0904   K 4.7 01/01/2020 0904   CL 105 01/01/2020 0904   CO2 28 01/01/2020 0904   GLUCOSE 104 (H) 01/01/2020 0904   BUN 12 01/01/2020 0904   CREATININE 0.90 01/01/2020 0904   CREATININE 0.90  10/01/2019 1530   CALCIUM 9.9 01/01/2020 0904   PROT 7.5 01/01/2020 0904   ALBUMIN 4.1 01/01/2020 0904   AST 17 01/01/2020 0904   AST 20 10/01/2019 1530   ALT 15 01/01/2020 0904   ALT 20 10/01/2019 1530   ALKPHOS 114 01/01/2020 0904   BILITOT 0.4 01/01/2020 0904   BILITOT 0.4 10/01/2019 1530   GFRNONAA >60 01/01/2020 0904   GFRNONAA >60 10/01/2019 1530   GFRAA >60 01/01/2020 0904   GFRAA >60 10/01/2019 1530    No results found for: TOTALPROTELP, ALBUMINELP, A1GS, A2GS, BETS, BETA2SER, GAMS, MSPIKE, SPEI  No results found for: KPAFRELGTCHN, LAMBDASER, [facility]  Lab Results  Component Value Date   WBC 6.2 01/01/2020   NEUTROABS 3.6 01/01/2020   HGB 13.5 01/01/2020   HCT 40.0 01/01/2020   MCV 85.3 01/01/2020   PLT 342 01/01/2020   No results found for: LABCA2  No components found for: HYWVPX106  No results for input(s): INR in the last 168 hours.  No results found for: LABCA2  No results found for: YIR485  No results found for: IOE703  No results found for: JKK938  No results found for: CA2729  No components found for: HGQUANT  No results found for: CEA1 / No results found for: CEA1   No results found for: AFPTUMOR  No results found for: CHROMOGRNA  No results found for: HGBA, HGBA2QUANT, HGBFQUANT, HGBSQUAN (Hemoglobinopathy evaluation)   No results found for: LDH  No results found for: IRON, TIBC, IRONPCTSAT (Iron and TIBC)  No results found for: FERRITIN  Urinalysis    Component Value Date/Time   COLORURINE STRAW (A) 05/01/2018 2012   APPEARANCEUR CLEAR (A) 05/01/2018 2012   LABSPEC 1.003 (L) 05/01/2018 2012   PHURINE 6.0 05/01/2018 2012   GLUCOSEU NEGATIVE 05/01/2018 2012   HGBUR SMALL (A) 05/01/2018 2012   [place] NEGATIVE 05/01/2018 2012   [place] NEGATIVE 05/01/2018 2012   [place] NEGATIVE 05/01/2018 2012   NITRITE NEGATIVE 05/01/2018 2012   LEUKOCYTESUR SMALL (A) 05/01/2018 2012    STUDIES: No results found.     ELIGIBLE FOR AVAILABLE RESEARCH PROTOCOL: no  ASSESSMENT: 39 y.o. [place] woman status post right breast overlapping sites lumpectomy and sentinel lymph node biopsy June 2017 (and subsequent axillary lymph node dissection 05/18/2016) showing a pT3 pN2, stage IIIA invasive ductal carcinoma, grade 3, estrogen and progesterone receptor positive, HER-2 not amplified.  (a) a total of 21 axillary lymph nodes removed, 6 of them positive  (1) [neo]adjuvant chemotherapy consisted of dose dense cyclophosphamide and doxorubicin x4 followed by  paclitaxel weekly x1, with the paclitaxel discontinued because of an infusion reaction  (a) docetaxel x3 Q 21 days completed 09/15/2016.  (2) status post bilateral mastectomies and left sentinel lymph node sampling 10/08/2016 showing  (a) on the left no evidence of malignancy; single sentinel lymph node removed  (b) on the right, residual multifocal invasive ductal carcinoma, mpT1a, grade 3, with positive lymphovascular invasion but negative margins  (c) repeat prognostic panel: Estrogen receptor 100% positive, progesterone receptor 50% positive, HER-2 not amplified, MIB-1 was 20%  (3) adjuvant radiation given between January 2018 and 12/27/2016  (4) adjuvant capecitabine (2 weeks on, 1 week off) started January 2018 and completed 8 cycles 04/16/2017  (5) tamoxifen started January 2018  (a) changed to letrozole/goserelin January 2019  (b) baseline DEXA scan May 2019 normal  (c) letrozole/goserelin discontinued 01/01/2020 at patient's preference  (6) negative genetics testing [[place] 2017]  through [facility]'s myRisk gene panel  (7) considering bilateral salpingo-oophorectomy  (8) considering denosumab or zoledronate  (a) restaging studies at [facility] 03/22/2019 included brain MRI, bone scan and CT scans of the chest abdomen and pelvis showed no evidence of metastatic disease but there was a new right anterior fifth rib fracture and 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  (9) tamoxifen resumed 01/01/2020   PLAN: Dhanya is concerned about hair thinning but also about the entire spectrum of early menopause.  She would like to go off the Zoladex/letrozole.  She understands that as compared to tamoxifen alone adding the Zoladex and going on letrozole reduces the risk of dying from this breast cancer by about 3%.  However it is true that she has received 3 years of this treatment, and if we compare Femara alone with 2 to 3 years of her marriage followed by 2 to 3 years of tamoxifen, the results are the same.  Accordingly after much discussion we decided to switch back to tamoxifen.  I am hoping this will help her here but that really has to take second place and she needs to have at least 2 more years of antiestrogens which ever she prefers.   I am going to check an [facility] and estradiol level prior to her visit with me in 3 months.  Recall that she was already having very irregular periods before she had the cancer diagnosis.  She is not interested in pursuing bilateral salpingo-oophorectomy at this time and so we are canceling her appointment today with Dr. Berline Lopes.  I am hopeful she will get good news on her foster care struggle.  Total encounter time 35 minutes.Chauncey Cruel, MD   01/01/2020 10:22 AM Medical Oncology and Hematology [facility] [place], [place] [address] Tel. [phone]    Fax. [phone]   This document serves as a record of services personally performed by Lurline Del, MD. It was created on his behalf by Wilburn Mylar, a trained medical scribe. The creation of this record is based on the scribe's personal observations and the provider's statements to them.   I, Lurline Del MD, have reviewed the above documentation for accuracy and completeness, and I agree with the above.   *Total Encounter Time as defined by the Centers  for Medicare and Medicaid Services includes, in addition to the face-to-face time of a patient visit (documented in the note above) non-face-to-face time: obtaining and reviewing outside history, ordering and reviewing medications, tests or procedures, care coordination (communications with other health  care professionals or caregivers) and documentation in the medical record.

## 2020-01-01 ENCOUNTER — Inpatient Hospital Stay (HOSPITAL_BASED_OUTPATIENT_CLINIC_OR_DEPARTMENT_OTHER): Payer: Commercial Managed Care - PPO | Admitting: Oncology

## 2020-01-01 ENCOUNTER — Inpatient Hospital Stay: Payer: Commercial Managed Care - PPO

## 2020-01-01 ENCOUNTER — Inpatient Hospital Stay: Payer: Commercial Managed Care - PPO | Admitting: Gynecologic Oncology

## 2020-01-01 ENCOUNTER — Inpatient Hospital Stay: Payer: Commercial Managed Care - PPO | Attending: Oncology

## 2020-01-01 ENCOUNTER — Other Ambulatory Visit: Payer: Self-pay

## 2020-01-01 VITALS — BP 112/72 | HR 65 | Temp 98.3°F | Resp 18 | Ht 60.0 in | Wt 159.5 lb

## 2020-01-01 DIAGNOSIS — C50811 Malignant neoplasm of overlapping sites of right female breast: Secondary | ICD-10-CM

## 2020-01-01 DIAGNOSIS — Z170 Estrogen receptor positive status [ER+]: Secondary | ICD-10-CM | POA: Diagnosis not present

## 2020-01-01 DIAGNOSIS — L659 Nonscarring hair loss, unspecified: Secondary | ICD-10-CM | POA: Diagnosis not present

## 2020-01-01 LAB — CBC WITH DIFFERENTIAL/PLATELET
Abs Immature Granulocytes: 0.01 10*3/uL (ref 0.00–0.07)
Basophils Absolute: 0.1 10*3/uL (ref 0.0–0.1)
Basophils Relative: 1 %
Eosinophils Absolute: 0.2 10*3/uL (ref 0.0–0.5)
Eosinophils Relative: 3 %
HCT: 40 % (ref 36.0–46.0)
Hemoglobin: 13.5 g/dL (ref 12.0–15.0)
Immature Granulocytes: 0 %
Lymphocytes Relative: 32 %
Lymphs Abs: 2 10*3/uL (ref 0.7–4.0)
MCH: 28.8 pg (ref 26.0–34.0)
MCHC: 33.8 g/dL (ref 30.0–36.0)
MCV: 85.3 fL (ref 80.0–100.0)
Monocytes Absolute: 0.4 10*3/uL (ref 0.1–1.0)
Monocytes Relative: 6 %
Neutro Abs: 3.6 10*3/uL (ref 1.7–7.7)
Neutrophils Relative %: 58 %
Platelets: 342 10*3/uL (ref 150–400)
RBC: 4.69 MIL/uL (ref 3.87–5.11)
RDW: 12.3 % (ref 11.5–15.5)
WBC: 6.2 10*3/uL (ref 4.0–10.5)
nRBC: 0 % (ref 0.0–0.2)

## 2020-01-01 LAB — COMPREHENSIVE METABOLIC PANEL
ALT: 15 U/L (ref 0–44)
AST: 17 U/L (ref 15–41)
Albumin: 4.1 g/dL (ref 3.5–5.0)
Alkaline Phosphatase: 114 U/L (ref 38–126)
Anion gap: 8 (ref 5–15)
BUN: 12 mg/dL (ref 6–20)
CO2: 28 mmol/L (ref 22–32)
Calcium: 9.9 mg/dL (ref 8.9–10.3)
Chloride: 105 mmol/L (ref 98–111)
Creatinine, Ser: 0.9 mg/dL (ref 0.44–1.00)
GFR calc Af Amer: >60 mL/min (ref >60)
GFR calc non Af Amer: >60 mL/min (ref >60)
Glucose, Bld: 104 mg/dL — ABNORMAL HIGH (ref 70–99)
Potassium: 4.7 mmol/L (ref 3.5–5.1)
Sodium: 141 mmol/L (ref 135–145)
Total Bilirubin: 0.4 mg/dL (ref 0.3–1.2)
Total Protein: 7.5 g/dL (ref 6.5–8.1)

## 2020-01-01 MED ORDER — TAMOXIFEN CITRATE 20 MG PO TABS: 20.0000 mg | ORAL_TABLET | Freq: Every day | ORAL | 12 refills | Status: AC

## 2020-01-01 NOTE — Progress Notes (Deleted)
Gynecologic Oncology Return Clinic Visit  @DATE @  Reason for Visit: ***  Treatment History: Oncology History   No history exists.    Interval History: ***  Past Medical/Surgical History: Past Medical History:  Diagnosis Date  . Breast cancer [facility])     Past Surgical History:  Procedure Laterality Date  . BREAST LUMPECTOMY Right   . MASTECTOMY, RADICAL      Family History  Problem Relation Age of Onset  . Other Mother        adopted, family history unk  . Asthma Father   . Brain cancer Paternal Grandfather   . Parkinson's disease Paternal Uncle   . Breast cancer Neg Hx   . Ovarian cancer Neg Hx     Social History   Socioeconomic History  . Marital status: Married    Spouse name: Not on file  . Number of children: Not on file  . Years of education: Not on file  . Highest education level: Not on file  Occupational History  . Not on file  Tobacco Use  . Smoking status: Never Smoker  . Smokeless tobacco: Never Used  Substance and Sexual Activity  . Alcohol use: No  . Drug use: No  . Sexual activity: Not on file  Other Topics Concern  . Not on file  Social History Narrative   Married.   No children.   Will be working with [employer] and Recreation through the [place].   Enjoys shopping, movies, yoga.     Social Determinants of Health   Financial Resource Strain:   . Difficulty of Paying Living Expenses: Not on file  Food Insecurity:   . Worried About Charity fundraiser in the Last Year: Not on file  . Ran Out of Food in the Last Year: Not on file  Transportation Needs:   . Lack of Transportation (Medical): Not on file  . Lack of Transportation (Non-Medical): Not on file  Physical Activity:   . Days of Exercise per Week: Not on file  . Minutes of Exercise per Session: Not on file  Stress:   . Feeling of Stress : Not on file  Social Connections:   . Frequency of Communication with Friends and Family: Not on file  . Frequency of Social  Gatherings with Friends and Family: Not on file  . Attends Religious Services: Not on file  . Active Member of Clubs or Organizations: Not on file  . Attends Archivist Meetings: Not on file  . Marital Status: Not on file    Current Medications:  Current Outpatient Medications:  .  letrozole (FEMARA) 2.5 MG tablet, Take 1 tablet (2.5 mg total) by mouth daily., Disp: 90 tablet, Rfl: 4 .  naproxen (NAPROSYN) 500 MG tablet, Take 1 tablet (500 mg total) by mouth 2 (two) times daily., Disp: 30 tablet, Rfl: 0  Review of Systems: Denies appetite changes, fevers, chills, fatigue, unexplained weight changes. Denies hearing loss, neck lumps or masses, mouth sores, ringing in ears or voice changes. Denies cough or wheezing.  Denies shortness of breath. Denies chest pain or palpitations. Denies leg swelling. Denies abdominal distention, pain, blood in stools, constipation, diarrhea, nausea, vomiting, or early satiety. Denies pain with intercourse, dysuria, frequency, hematuria or incontinence. Denies hot flashes, pelvic pain, vaginal bleeding or vaginal discharge.   Denies joint pain, back pain or muscle pain/cramps. Denies itching, rash, or wounds. Denies dizziness, headaches, numbness or seizures. Denies swollen lymph nodes or glands, denies easy bruising or  bleeding. Denies anxiety, depression, confusion, or decreased concentration.  Physical Exam: There were no vitals taken for this visit. General: ***Alert, oriented, no acute distress. HEENT: ***Posterior oropharynx clear, sclera anicteric. Chest: ***Clear to auscultation bilaterally.  ***Port site clean. Cardiovascular: ***Regular rate and rhythm, no murmurs. Abdomen: ***Obese, soft, nontender.  Normoactive bowel sounds.  No masses or hepatosplenomegaly appreciated.  ***Well-healed scar. Extremities: ***Grossly normal range of motion.  Warm, well perfused.  No edema bilaterally. Skin: ***No rashes or lesions noted. Lymphatics:  ***No cervical, supraclavicular, or inguinal adenopathy. GU: Normal appearing external genitalia without erythema, excoriation, or lesions.  Speculum exam reveals ***.  Bimanual exam reveals ***.  ***Rectovaginal exam  confirms ___.  Laboratory & Radiologic Studies: ***  Assessment & Plan: Monica Black is a 39 y.o. woman with Stage *** who presents for ***.  ***  *** minutes of total time was spent for this patient encounter, including preparation, face-to-face counseling with the patient and coordination of care, and documentation of the encounter.  Jeral Pinch, MD  Division of Gynecologic Oncology  Department of Obstetrics and Gynecology  [facility]

## 2020-01-29 ENCOUNTER — Ambulatory Visit: Payer: Commercial Managed Care - PPO

## 2020-02-26 ENCOUNTER — Ambulatory Visit: Payer: Commercial Managed Care - PPO

## 2020-02-28 ENCOUNTER — Encounter: Payer: Self-pay | Admitting: Primary Care

## 2020-02-28 ENCOUNTER — Other Ambulatory Visit: Payer: Self-pay

## 2020-02-28 ENCOUNTER — Ambulatory Visit (INDEPENDENT_AMBULATORY_CARE_PROVIDER_SITE_OTHER): Payer: Commercial Managed Care - PPO | Admitting: Primary Care

## 2020-02-28 VITALS — BP 110/72 | HR 69 | Temp 96.0°F | Ht 60.0 in | Wt 156.5 lb

## 2020-02-28 DIAGNOSIS — Z0000 Encounter for general adult medical examination without abnormal findings: Secondary | ICD-10-CM | POA: Diagnosis not present

## 2020-02-28 DIAGNOSIS — C50811 Malignant neoplasm of overlapping sites of right female breast: Secondary | ICD-10-CM

## 2020-02-28 DIAGNOSIS — Z170 Estrogen receptor positive status [ER+]: Secondary | ICD-10-CM | POA: Diagnosis not present

## 2020-02-28 NOTE — Progress Notes (Signed)
Subjective:    Patient ID: Monica Black, female    DOB: 1981/01/08, 39 y.o.   MRN: [id number]  HPI  This visit occurred during the SARS-CoV-2 public health emergency.  Safety protocols were in place, including screening questions prior to the visit, additional usage of staff PPE, and extensive cleaning of exam room while observing appropriate contact time as indicated for disinfecting solutions.   Monica Black is a 39 year old female who presents today for complete physical.  Immunizations: -Tetanus: Unsure, over 10 years. Declines.  -Influenza: Declines -Covid-19: Declines   Diet: She endorses a healthy diet.  Exercise: No regular exercise  Eye exam: Completes annually  Dental exam: No recent exam  Pap Smear: Completed in 2019 Mammogram: Double mastectomy. Follows with oncology.   BP Readings from Last 3 Encounters:  02/28/20 110/72  01/01/20 112/72  12/04/19 119/73     Review of Systems  Constitutional: Negative for unexpected weight change.  HENT: Negative for rhinorrhea.   Respiratory: Negative for cough and shortness of breath.   Cardiovascular: Negative for chest pain.  Gastrointestinal: Negative for constipation and diarrhea.  Genitourinary: Negative for difficulty urinating.  Musculoskeletal: Negative for arthralgias.  Skin: Negative for rash.  Allergic/Immunologic: Positive for environmental allergies.  Neurological: Negative for dizziness and headaches.       Intermittent nerve pain  Psychiatric/Behavioral: The patient is not nervous/anxious.        Past Medical History:  Diagnosis Date  . Breast cancer [facility])      Social History   Socioeconomic History  . Marital status: Married    Spouse name: Not on file  . Number of children: Not on file  . Years of education: Not on file  . Highest education level: Not on file  Occupational History  . Not on file  Tobacco Use  . Smoking status: Never Smoker  . Smokeless tobacco: Never Used  Substance  and Sexual Activity  . Alcohol use: No  . Drug use: No  . Sexual activity: Not on file  Other Topics Concern  . Not on file  Social History Narrative   Married.   No children.   Will be working with [employer] and Recreation through the [place].   Enjoys shopping, movies, yoga.     Social Determinants of Health   Financial Resource Strain:   . Difficulty of Paying Living Expenses:   Food Insecurity:   . Worried About Charity fundraiser in the Last Year:   . Arboriculturist in the Last Year:   Transportation Needs:   . Film/video editor (Medical):   Marland Kitchen Lack of Transportation (Non-Medical):   Physical Activity:   . Days of Exercise per Week:   . Minutes of Exercise per Session:   Stress:   . Feeling of Stress :   Social Connections:   . Frequency of Communication with Friends and Family:   . Frequency of Social Gatherings with Friends and Family:   . Attends Religious Services:   . Active Member of Clubs or Organizations:   . Attends Archivist Meetings:   Marland Kitchen Marital Status:   Intimate Partner Violence:   . Fear of Current or Ex-Partner:   . Emotionally Abused:   Marland Kitchen Physically Abused:   . Sexually Abused:     Past Surgical History:  Procedure Laterality Date  . BREAST LUMPECTOMY Right   . MASTECTOMY, RADICAL      Family History  Problem Relation Age of  Onset  . Other Mother        adopted, family history unk  . Asthma Father   . Brain cancer Paternal Grandfather   . Parkinson's disease Paternal Uncle   . Breast cancer Neg Hx   . Ovarian cancer Neg Hx     Allergies  Allergen Reactions  . Acetaminophen Itching  . Penicillins Hives    Current Outpatient Medications on File Prior to Visit  Medication Sig Dispense Refill  . tamoxifen (NOLVADEX) 20 MG tablet Take 20 mg by mouth daily.      No current facility-administered medications on file prior to visit.    BP 110/72   Pulse 69   Temp (!) 96 F (35.6 C) (Temporal)   Ht 5' (1.524  m)   Wt 156 lb 8 oz (71 kg)   SpO2 98%   BMI 30.56 kg/m    Objective:   Physical Exam  Constitutional: She is oriented to person, place, and time. She appears well-nourished.  HENT:  Right Ear: Tympanic membrane and ear canal normal.  Left Ear: Tympanic membrane and ear canal normal.  Mouth/Throat: Oropharynx is clear and moist.  Eyes: Pupils are equal, round, and reactive to light. EOM are normal.  Cardiovascular: Normal rate and regular rhythm.  Respiratory: Effort normal and breath sounds normal.  GI: Soft. Bowel sounds are normal. There is no abdominal tenderness.  Musculoskeletal:        General: Normal range of motion.     Cervical back: Neck supple.  Neurological: She is alert and oriented to person, place, and time. No cranial nerve deficit.  Reflex Scores:      Patellar reflexes are 2+ on the right side and 2+ on the left side. Skin: Skin is warm and dry.  Psychiatric: She has a normal mood and affect.           Assessment & Plan:

## 2020-02-28 NOTE — Assessment & Plan Note (Addendum)
Following with oncology, compliant to Tamoxifen for which she will take for another two years. MR breasts from 2020 negative for malignancy, NM bone scan in 2020 negative for metastases.

## 2020-02-28 NOTE — Assessment & Plan Note (Addendum)
Tetanus due, she declines today. Pap smear UTD. Encouraged regular exercise, healthy diet.  Exam today unremarkable. Labs reviewed from December 2020.

## 2020-02-28 NOTE — Patient Instructions (Addendum)
Start exercising. You should be getting 150 minutes of moderate intensity exercise weekly.  Continue to owkr on a healthy diet. Ensure you are consuming 64 ounces of water daily.  It was a pleasure to see you today!   Preventive Care 39-39 Years Old, Female Preventive care refers to visits with your health care provider and lifestyle choices that can promote health and wellness. This includes:  A yearly physical exam. This may also be called an annual well check.  Regular dental visits and eye exams.  Immunizations.  Screening for certain conditions.  Healthy lifestyle choices, such as eating a healthy diet, getting regular exercise, not using drugs or products that contain nicotine and tobacco, and limiting alcohol use. What can I expect for my preventive care visit? Physical exam Your health care provider will check your:  Height and weight. This may be used to calculate body mass index (BMI), which tells if you are at a healthy weight.  Heart rate and blood pressure.  Skin for abnormal spots. Counseling Your health care provider may ask you questions about your:  Alcohol, tobacco, and drug use.  Emotional well-being.  Home and relationship well-being.  Sexual activity.  Eating habits.  Work and work Statistician.  Method of birth control.  Menstrual cycle.  Pregnancy history. What immunizations do I need?  Influenza (flu) vaccine  This is recommended every year. Tetanus, diphtheria, and pertussis (Tdap) vaccine  You may need a Td booster every 10 years. Varicella (chickenpox) vaccine  You may need this if you have not been vaccinated. Human papillomavirus (HPV) vaccine  If recommended by your health care provider, you may need three doses over 6 months. Measles, mumps, and rubella (MMR) vaccine  You may need at least one dose of MMR. You may also need a second dose. Meningococcal conjugate (MenACWY) vaccine  One dose is recommended if you are age  39-21 years and a first-year college student living in a residence hall, or if you have one of several medical conditions. You may also need additional booster doses. Pneumococcal conjugate (PCV13) vaccine  You may need this if you have certain conditions and were not previously vaccinated. Pneumococcal polysaccharide (PPSV23) vaccine  You may need one or two doses if you smoke cigarettes or if you have certain conditions. Hepatitis A vaccine  You may need this if you have certain conditions or if you travel or work in places where you may be exposed to hepatitis A. Hepatitis B vaccine  You may need this if you have certain conditions or if you travel or work in places where you may be exposed to hepatitis B. Haemophilus influenzae type b (Hib) vaccine  You may need this if you have certain conditions. You may receive vaccines as individual doses or as more than one vaccine together in one shot (combination vaccines). Talk with your health care provider about the risks and benefits of combination vaccines. What tests do I need?  Blood tests  Lipid and cholesterol levels. These may be checked every 5 years starting at age 16.  Hepatitis C test.  Hepatitis B test. Screening  Diabetes screening. This is done by checking your blood sugar (glucose) after you have not eaten for a while (fasting).  Sexually transmitted disease (STD) testing.  BRCA-related cancer screening. This may be done if you have a family history of breast, ovarian, tubal, or peritoneal cancers.  Pelvic exam and Pap test. This may be done every 3 years starting at age 39. Starting at  age 93, this may be done every 5 years if you have a Pap test in combination with an HPV test. Talk with your health care provider about your test results, treatment options, and if necessary, the need for more tests. Follow these instructions at home: Eating and drinking   Eat a diet that includes fresh fruits and vegetables, whole  grains, lean protein, and low-fat dairy.  Take vitamin and mineral supplements as recommended by your health care provider.  Do not drink alcohol if: ? Your health care provider tells you not to drink. ? You are pregnant, may be pregnant, or are planning to become pregnant.  If you drink alcohol: ? Limit how much you have to 0-1 drink a day. ? Be aware of how much alcohol is in your drink. In the [place] one drink equals one 12 oz bottle of beer (355 mL), one 5 oz glass of wine (148 mL), or one 1 oz glass of hard liquor (44 mL). Lifestyle  Take daily care of your teeth and gums.  Stay active. Exercise for at least 30 minutes on 5 or more days each week.  Do not use any products that contain nicotine or tobacco, such as cigarettes, e-cigarettes, and chewing tobacco. If you need help quitting, ask your health care provider.  If you are sexually active, practice safe sex. Use a condom or other form of birth control (contraception) in order to prevent pregnancy and STIs (sexually transmitted infections). If you plan to become pregnant, see your health care provider for a preconception visit. What's next?  Visit your health care provider once a year for a well check visit.  Ask your health care provider how often you should have your eyes and teeth checked.  Stay up to date on all vaccines. This information is not intended to replace advice given to you by your health care provider. Make sure you discuss any questions you have with your health care provider. Document Revised: 06/22/2018 Document Reviewed: 06/22/2018 Elsevier Patient Education  2020 [employer].

## 2020-03-25 ENCOUNTER — Ambulatory Visit: Payer: Commercial Managed Care - PPO

## 2020-03-26 ENCOUNTER — Other Ambulatory Visit: Payer: Self-pay

## 2020-03-26 ENCOUNTER — Inpatient Hospital Stay: Payer: Commercial Managed Care - PPO | Attending: Oncology

## 2020-03-26 DIAGNOSIS — C773 Secondary and unspecified malignant neoplasm of axilla and upper limb lymph nodes: Secondary | ICD-10-CM | POA: Insufficient documentation

## 2020-03-26 DIAGNOSIS — C50811 Malignant neoplasm of overlapping sites of right female breast: Secondary | ICD-10-CM | POA: Insufficient documentation

## 2020-03-26 DIAGNOSIS — Z923 Personal history of irradiation: Secondary | ICD-10-CM | POA: Insufficient documentation

## 2020-03-26 DIAGNOSIS — Z9013 Acquired absence of bilateral breasts and nipples: Secondary | ICD-10-CM | POA: Insufficient documentation

## 2020-03-26 DIAGNOSIS — Z9181 History of falling: Secondary | ICD-10-CM | POA: Diagnosis not present

## 2020-03-26 DIAGNOSIS — Z170 Estrogen receptor positive status [ER+]: Secondary | ICD-10-CM | POA: Insufficient documentation

## 2020-03-26 LAB — CBC WITH DIFFERENTIAL/PLATELET
Abs Immature Granulocytes: 0.01 10*3/uL (ref 0.00–0.07)
Basophils Absolute: 0.1 10*3/uL (ref 0.0–0.1)
Basophils Relative: 1 %
Eosinophils Absolute: 0.1 10*3/uL (ref 0.0–0.5)
Eosinophils Relative: 2 %
HCT: 38.3 % (ref 36.0–46.0)
Hemoglobin: 13 g/dL (ref 12.0–15.0)
Immature Granulocytes: 0 %
Lymphocytes Relative: 33 %
Lymphs Abs: 2.3 10*3/uL (ref 0.7–4.0)
MCH: 29.5 pg (ref 26.0–34.0)
MCHC: 33.9 g/dL (ref 30.0–36.0)
MCV: 87 fL (ref 80.0–100.0)
Monocytes Absolute: 0.4 10*3/uL (ref 0.1–1.0)
Monocytes Relative: 6 %
Neutro Abs: 4 10*3/uL (ref 1.7–7.7)
Neutrophils Relative %: 58 %
Platelets: 336 10*3/uL (ref 150–400)
RBC: 4.4 MIL/uL (ref 3.87–5.11)
RDW: 12.5 % (ref 11.5–15.5)
WBC: 6.8 10*3/uL (ref 4.0–10.5)
nRBC: 0 % (ref 0.0–0.2)

## 2020-03-26 LAB — COMPREHENSIVE METABOLIC PANEL
ALT: 16 U/L (ref 0–44)
AST: 18 U/L (ref 15–41)
Albumin: 4 g/dL (ref 3.5–5.0)
Alkaline Phosphatase: 81 U/L (ref 38–126)
Anion gap: 10 (ref 5–15)
BUN: 12 mg/dL (ref 6–20)
CO2: 26 mmol/L (ref 22–32)
Calcium: 9.5 mg/dL (ref 8.9–10.3)
Chloride: 104 mmol/L (ref 98–111)
Creatinine, Ser: 0.93 mg/dL (ref 0.44–1.00)
GFR calc Af Amer: >60 mL/min (ref >60)
GFR calc non Af Amer: >60 mL/min (ref >60)
Glucose, Bld: 118 mg/dL — ABNORMAL HIGH (ref 70–99)
Potassium: 4.1 mmol/L (ref 3.5–5.1)
Sodium: 140 mmol/L (ref 135–145)
Total Bilirubin: 0.4 mg/dL (ref 0.3–1.2)
Total Protein: 7.4 g/dL (ref 6.5–8.1)

## 2020-03-27 LAB — FOLLICLE STIMULATING HORMONE: FSH: 2.1 m[IU]/mL

## 2020-03-28 LAB — ESTRADIOL, ULTRA SENS: Estradiol, Sensitive: 3.7 pg/mL

## 2020-04-02 ENCOUNTER — Telehealth: Payer: Self-pay | Admitting: Oncology

## 2020-04-02 ENCOUNTER — Other Ambulatory Visit: Payer: Self-pay

## 2020-04-02 ENCOUNTER — Inpatient Hospital Stay (HOSPITAL_BASED_OUTPATIENT_CLINIC_OR_DEPARTMENT_OTHER): Payer: Commercial Managed Care - PPO | Admitting: Oncology

## 2020-04-02 VITALS — BP 115/68 | HR 75 | Temp 98.7°F | Resp 20 | Ht 60.0 in | Wt 159.2 lb

## 2020-04-02 DIAGNOSIS — C50811 Malignant neoplasm of overlapping sites of right female breast: Secondary | ICD-10-CM

## 2020-04-02 DIAGNOSIS — Z170 Estrogen receptor positive status [ER+]: Secondary | ICD-10-CM

## 2020-04-02 NOTE — Progress Notes (Signed)
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   She resumed tamoxifen at her last visit in 12/2019.  She is tolerating that well, with no significant hot flashes, vaginal wetness, or other obvious side effects  Her most recent bone density screening was performed in 02/2018 and was considered normal. She was due for repeat after 02/2020.    REVIEW OF SYSTEMS: Monica Black unfortunately lost the foster ship that she and her husband were hoping to have.  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  She is enjoying her new home.  She says that within a week of stopping the letrozole her hair started to feel fuller and grow faster.  Aside from that detailed review of systems today was stable.  She did have a trip and fall at the bottom of set of stairs and hit her lower back.  She has intermittent pain there but it does not radiate down the leg.  She is not exercising regularly at present   HISTORY OF CURRENT ILLNESS: From the original intake note:  Monica Black has a history of breast cancer dating back to 2017. She moved from [place] to [place] in 07/2017 and transferred her care to [facility] in [place], [place].   She had screening mammogram in June 2017 showing microcalcifications in the right breast. She proceeded to biopsy that month that showed by her  recollection "stage 0" cancer. She tells me she had a PET scan at that time which did not show any adenopathy. 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   She was subsequently treated with chemotherapy consisting of dose dense AC x4 cycles. She then received one dose of weekly taxol, but she had an infusion reaction. She was switched to taxotere and completed 3 cycles given 21 days apart on 09/15/2016.  She opted to undergo bilateral mastectomies with left sentinel lymph node biopsy on 10/08/2016 but decided against immediate DIEP reconstruction.. Final pathology [id number]) revealed right breast multifocal residual invasive ductal carcinoma, with 2 foci measuring 0.5 cm each, grade 3; lymphovascular invasion present; margins negative. Left breast and the single left sentinel lymph node were negative for malignancy.   She was then placed on tamoxifen, as well as Xeloda for 2 weeks on and 1 week off, in 10/2016. She received concurrent radiation therapy from 10/2016 through 12/27/2016. She completed 8 cycles of Xeloda on 04/16/2017.  Genetic testing was also performed while she was in [place], which showed no pathogenic mutations.  Although her periods never recurred, she was switched to Zoladex/letrozole on 10/11/2017. She is tolerating this well.  The patient's subsequent history is as detailed below.   PAST MEDICAL HISTORY: Past Medical History:  Diagnosis  Date  . Breast cancer [facility])     PAST SURGICAL HISTORY: Past Surgical History:  Procedure Laterality Date  . BREAST LUMPECTOMY Right   . MASTECTOMY, RADICAL    Status post left elbow surgery   FAMILY HISTORY: Family  History  Problem Relation Age of Onset  . Other Mother        adopted, family history unk  . Asthma Father   . Brain cancer Paternal Grandfather   . Parkinson's disease Paternal Uncle   . Breast cancer Neg Hx   . Ovarian cancer Neg Hx    The patient's mother was adopted and has no information regarding her biologic family. She is 39 years old as of December 2020. The patient's father is 76 years old as of December 2020. He has a history of asthma. His father had brain cancer. The patient's father had 1 brother, with Parkinson's disease, and 2 sisters, 1 of whom died at a young age. There is no history of breast or ovarian cancer on his side of the family. The patient himself has 2 brothers, aged 74 and 68 as of December 2020. She is not aware of any other cancer history in the family   GYNECOLOGIC HISTORY:  The patient thinks her last menstrual period was around October 2015. Her periods had been scant and irregular prior to that. From her marriage November 2015 through the start of chemo August 2017 (21 months) she and her husband used no contraceptives and she did not get pregnant. Menarche: 39 years old [place] P 0 LMP 04/2016 Contraceptive used for 1 year HRT n/a  Hysterectomy? no BSO? no   SOCIAL HISTORY: (updated 09/2019)  Monica Black is currently working part-time at [employer]. Her husband Monica Black works for [employer] doing analysis of [employer]. They attend [employer]. They are currently fostering 2 little girls they hope to adopt.    ADVANCED DIRECTIVES: In the absence of any documentation to the contrary, the patient's spouse is their HCPOA.   HEALTH MAINTENANCE: Social History   Tobacco Use  . Smoking status: Never Smoker  . Smokeless tobacco: Never Used  Substance Use Topics  . Alcohol use: No  . Drug use: No     Colonoscopy: n/a  PAP: 04/2018, negative  Bone density: 2019?   Allergies  Allergen Reactions  . Acetaminophen Itching  . Penicillins  Hives    Current Outpatient Medications  Medication Sig Dispense Refill  . tamoxifen (NOLVADEX) 20 MG tablet Take 20 mg by mouth daily.      No current facility-administered medications for this visit.    OBJECTIVE: white woman in no acute distress  Vitals:   04/02/20 1340  BP: 115/68  Pulse: 75  Resp: 20  Temp: 98.7 F (37.1 C)  SpO2: 98%     Body mass index is 31.09 kg/m.   Wt Readings from Last 3 Encounters:  04/02/20 159 lb 3.2 oz (72.2 kg)  02/28/20 156 lb 8 oz (71 kg)  01/01/20 159 lb 8 oz (72.3 kg)      ECOG FS:1 - Symptomatic but completely ambulatory  Sclerae unicteric, EOMs intact Wearing a mask No cervical or supraclavicular adenopathy Lungs no rales or rhonchi Heart regular rate and rhythm Abd soft, nontender, positive bowel sounds MSK no focal spinal tenderness, no upper extremity lymphedema Neuro: nonfocal, well oriented, appropriate affect Breasts: Status post bilateral mastectomies.  There is no evidence of local recurrence.  Both axillae are benign.   LAB RESULTS:  CMP  Component Value Date/Time   NA 140 03/26/2020 1321   K 4.1 03/26/2020 1321   CL 104 03/26/2020 1321   CO2 26 03/26/2020 1321   GLUCOSE 118 (H) 03/26/2020 1321   BUN 12 03/26/2020 1321   CREATININE 0.93 03/26/2020 1321   CREATININE 0.90 10/01/2019 1530   CALCIUM 9.5 03/26/2020 1321   PROT 7.4 03/26/2020 1321   ALBUMIN 4.0 03/26/2020 1321   AST 18 03/26/2020 1321   AST 20 10/01/2019 1530   ALT 16 03/26/2020 1321   ALT 20 10/01/2019 1530   ALKPHOS 81 03/26/2020 1321   BILITOT 0.4 03/26/2020 1321   BILITOT 0.4 10/01/2019 1530   GFRNONAA >60 03/26/2020 1321   GFRNONAA >60 10/01/2019 1530   GFRAA >60 03/26/2020 1321   GFRAA >60 10/01/2019 1530    No results found for: TOTALPROTELP, ALBUMINELP, A1GS, A2GS, BETS, BETA2SER, GAMS, MSPIKE, SPEI  No results found for: KPAFRELGTCHN, LAMBDASER, KAPLAMBRATIO  Lab Results  Component Value Date   WBC 6.8 03/26/2020    NEUTROABS 4.0 03/26/2020   HGB 13.0 03/26/2020   HCT 38.3 03/26/2020   MCV 87.0 03/26/2020   PLT 336 03/26/2020   No results found for: LABCA2  No components found for: [id number]  No results for input(s): INR in the last 168 hours.  No results found for: LABCA2  No results found for: DDU202  No results found for: RKY706  No results found for: CBJ628  No results found for: CA2729  No components found for: HGQUANT  No results found for: CEA1 / No results found for: CEA1   No results found for: AFPTUMOR  No results found for: CHROMOGRNA  No results found for: HGBA, HGBA2QUANT, HGBFQUANT, HGBSQUAN (Hemoglobinopathy evaluation)   No results found for: LDH  No results found for: IRON, TIBC, IRONPCTSAT (Iron and TIBC)  No results found for: FERRITIN  Urinalysis    Component Value Date/Time   COLORURINE STRAW (A) 05/01/2018 2012   APPEARANCEUR CLEAR (A) 05/01/2018 2012   LABSPEC 1.003 (L) 05/01/2018 2012   PHURINE 6.0 05/01/2018 2012   GLUCOSEU NEGATIVE 05/01/2018 2012   HGBUR SMALL (A) 05/01/2018 2012   [place] NEGATIVE 05/01/2018 2012   [place] NEGATIVE 05/01/2018 2012   [place] NEGATIVE 05/01/2018 2012   NITRITE NEGATIVE 05/01/2018 2012   LEUKOCYTESUR SMALL (A) 05/01/2018 2012    STUDIES: No results found.    ELIGIBLE FOR AVAILABLE RESEARCH PROTOCOL: no  ASSESSMENT: 39 y.o. [place], [place] woman status post right breast overlapping sites lumpectomy and sentinel lymph node biopsy June 2017 (and subsequent axillary lymph node dissection 05/18/2016) showing a pT3 pN2, stage IIIA invasive ductal carcinoma, grade 3, estrogen and progesterone receptor positive, HER-2 not amplified.  (a) a total of 21 axillary lymph nodes removed, 6 of them positive  (1) [neo]adjuvant chemotherapy consisted of dose dense cyclophosphamide and doxorubicin x4 followed by paclitaxel weekly x1, with the paclitaxel discontinued because of an infusion reaction  (a) docetaxel x3 Q  21 days completed 09/15/2016.  (2) status post bilateral mastectomies and left sentinel lymph node sampling 10/08/2016 showing  (a) on the left no evidence of malignancy; single sentinel lymph node removed  (b) on the right, residual multifocal invasive ductal carcinoma, mpT1a, grade 3, with positive lymphovascular invasion but negative margins  (c) repeat prognostic panel: Estrogen receptor 100% positive, progesterone receptor 50% positive, HER-2 not amplified, MIB-1 was 20%  (3) adjuvant radiation given between January 2018 and 12/27/2016  (4) adjuvant capecitabine (2 weeks on, 1 week off) started January 2018  and completed 8 cycles 04/16/2017  (5) tamoxifen started January 2018  (a) changed to letrozole/goserelin January 2019  (b) baseline DEXA scan May 2019 normal  (c) letrozole/goserelin discontinued 01/01/2020 at patient's preference  (6) negative genetics testing [[place] 2017]  through [facility] gene panel  (7) considering bilateral salpingo-oophorectomy  (8) considering denosumab or zoledronate  (a) restaging studies at [facility] 03/22/2019 included brain MRI, bone scan and CT scans of the chest abdomen and pelvis showed no evidence of metastatic disease but there was a new right anterior fifth rib fracture and 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  (9) tamoxifen resumed 01/01/2020   PLAN: Monica Black is now 3-1/2 years out from definitive surgery for her breast cancer with no evidence of disease recurrence.  This is very favorable.  She is tolerating tamoxifen well and the plan is to continue that to total a minimum of 5 years on antiestrogens  We reviewed her [place] and estradiol.  Her estradiol is low but her FSH is also low.  This indicates continuing effect of her Zoladex.  She understands it does not mean that she is postmenopausal.  She sees her primary care NP in May.  I am going to see her again in November and then  yearly thereafter.  Before the November visit she will have repeat [place] and estradiol levels.  She will let me know if she has any other issues or concerns before that visit  Total encounter time 25 minutes.Chauncey Cruel, MD   04/02/2020 2:07 PM Medical Oncology and Hematology [facility] [place], [place] [address] Tel. [phone]    Fax. [phone]   This document serves as a record of services personally performed by Lurline Del, MD. It was created on his behalf by Wilburn Mylar, a trained medical scribe. The creation of this record is based on the scribe's personal observations and the provider's statements to them.   I, Lurline Del MD, have reviewed the above documentation for accuracy and completeness, and I agree with the above.   *Total Encounter Time as defined by the Centers for Medicare and Medicaid Services includes, in addition to the face-to-face time of a patient visit (documented in the note above) non-face-to-face time: obtaining and reviewing outside history, ordering and reviewing medications, tests or procedures, care coordination (communications with other health care professionals or caregivers) and documentation in the medical record.

## 2020-04-02 NOTE — Telephone Encounter (Signed)
Scheduled appts per 6/9 los. Pt declined print out of AVS and stated she would refer to mychart.  

## 2020-08-21 ENCOUNTER — Other Ambulatory Visit: Payer: Self-pay

## 2020-08-21 ENCOUNTER — Inpatient Hospital Stay: Payer: Commercial Managed Care - PPO | Attending: Oncology

## 2020-08-21 DIAGNOSIS — Z170 Estrogen receptor positive status [ER+]: Secondary | ICD-10-CM | POA: Diagnosis not present

## 2020-08-21 DIAGNOSIS — C773 Secondary and unspecified malignant neoplasm of axilla and upper limb lymph nodes: Secondary | ICD-10-CM | POA: Diagnosis not present

## 2020-08-21 DIAGNOSIS — C50811 Malignant neoplasm of overlapping sites of right female breast: Secondary | ICD-10-CM | POA: Insufficient documentation

## 2020-08-21 LAB — COMPREHENSIVE METABOLIC PANEL
ALT: 17 U/L (ref 0–44)
AST: 21 U/L (ref 15–41)
Albumin: 3.9 g/dL (ref 3.5–5.0)
Alkaline Phosphatase: 59 U/L (ref 38–126)
Anion gap: 9 (ref 5–15)
BUN: 13 mg/dL (ref 6–20)
CO2: 25 mmol/L (ref 22–32)
Calcium: 9.3 mg/dL (ref 8.9–10.3)
Chloride: 106 mmol/L (ref 98–111)
Creatinine, Ser: 0.83 mg/dL (ref 0.44–1.00)
GFR, Estimated: >60 mL/min (ref >60)
Glucose, Bld: 117 mg/dL — ABNORMAL HIGH (ref 70–99)
Potassium: 4.1 mmol/L (ref 3.5–5.1)
Sodium: 140 mmol/L (ref 135–145)
Total Bilirubin: 0.4 mg/dL (ref 0.3–1.2)
Total Protein: 7.1 g/dL (ref 6.5–8.1)

## 2020-08-21 LAB — CBC WITH DIFFERENTIAL/PLATELET
Abs Immature Granulocytes: 0.01 10*3/uL (ref 0.00–0.07)
Basophils Absolute: 0.1 10*3/uL (ref 0.0–0.1)
Basophils Relative: 1 %
Eosinophils Absolute: 0.1 10*3/uL (ref 0.0–0.5)
Eosinophils Relative: 1 %
HCT: 36.4 % (ref 36.0–46.0)
Hemoglobin: 12.5 g/dL (ref 12.0–15.0)
Immature Granulocytes: 0 %
Lymphocytes Relative: 30 %
Lymphs Abs: 2.3 10*3/uL (ref 0.7–4.0)
MCH: 29.3 pg (ref 26.0–34.0)
MCHC: 34.3 g/dL (ref 30.0–36.0)
MCV: 85.4 fL (ref 80.0–100.0)
Monocytes Absolute: 0.6 10*3/uL (ref 0.1–1.0)
Monocytes Relative: 7 %
Neutro Abs: 4.5 10*3/uL (ref 1.7–7.7)
Neutrophils Relative %: 61 %
Platelets: 337 10*3/uL (ref 150–400)
RBC: 4.26 MIL/uL (ref 3.87–5.11)
RDW: 11.9 % (ref 11.5–15.5)
WBC: 7.5 10*3/uL (ref 4.0–10.5)
nRBC: 0 % (ref 0.0–0.2)

## 2020-08-22 LAB — FOLLICLE STIMULATING HORMONE: FSH: 6.7 m[IU]/mL

## 2020-08-24 NOTE — Progress Notes (Signed)
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   She continues on tamoxifen.  She is tolerating that well, with no significant hot flashes, vaginal wetness, or other obvious side effects   REVIEW OF SYSTEMS: Monica Black does have some discomfort in the right axilla particularly sometimes also in the right chest wall area.  These were in her for the possibility of recurrence.  She has gained weight and does not like that she would like to lose about 40 pounds.  She has been able to lose quite a bit of weight in the past she says she is doing some stretching for exercise but currently she is fostering a 66-monthold and walks some all the time.  She is having some night sweats, no real hot flashes.  She is having some vaginal wetness from the tamoxifen.  Detailed review of systems today was otherwise stable.   COVID 19 VACCINATION STATUS: Refuses vaccination   HISTORY OF CURRENT ILLNESS: From the original intake note:  Monica Black has a history of breast cancer dating back to 2017. [place] [facility] [place]   She had screening mammogram in June 2017 showing microcalcifications in the right breast. She proceeded to biopsy that month that showed by her recollection "stage 0" cancer. She tells me she had a PET scan at that time which did  not show any adenopathy. 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   She was subsequently treated with chemotherapy consisting of dose dense AC x4 cycles. She then received one dose of weekly taxol, but she had an infusion reaction. She was switched to taxotere and completed 3 cycles given 21 days apart on 09/15/2016.  She opted to undergo bilateral mastectomies with left sentinel lymph node biopsy on 10/08/2016 but decided against immediate DIEP reconstruction.. Final pathology ([id number] revealed right breast multifocal residual invasive ductal carcinoma, with 2 foci measuring 0.5 cm each, grade 3; lymphovascular invasion present; margins negative. Left breast and the single left sentinel lymph node were negative for malignancy.   She was then placed on tamoxifen, as well as Xeloda for 2 weeks on and 1 week off, in 10/2016. She received concurrent radiation therapy from 10/2016 through 12/27/2016. She completed 8 cycles of Xeloda on 04/16/2017.  Genetic testing was also [place] which showed no pathogenic mutations.  Although her periods never recurred, she was switched to Zoladex/letrozole on 10/11/2017. She is tolerating this well.  The patient's subsequent history is as detailed below.   PAST MEDICAL HISTORY: Past Medical History:  Diagnosis Date  . Breast cancer ([place]     PAST SURGICAL HISTORY: Past Surgical History:  Procedure Laterality Date  .  BREAST LUMPECTOMY Right   . MASTECTOMY, RADICAL    Status post left elbow surgery   FAMILY HISTORY: Family History  Problem Relation Age of Onset  . Other Mother        adopted, family history  unk  . Asthma Father   . Brain cancer Paternal Grandfather   . Parkinson's disease Paternal Uncle   . Breast cancer Neg Hx   . Ovarian cancer Neg Hx    The patient's mother was adopted and has no information regarding her biologic family. She is 39 years old as of December 2020. The patient's father is 37 years old as of December 2020. He has a history of asthma. His father had brain cancer. The patient's father had 1 brother, with Parkinson's disease, and 2 sisters, 1 of whom died at a young age. There is no history of breast or ovarian cancer on his side of the family. The patient himself has 2 brothers, aged 23 and 39 as of December 2020. She is not aware of any other cancer history in the family   GYNECOLOGIC HISTORY:  The patient thinks her last menstrual period was around October 2015. Her periods had been scant and irregular prior to that. From her marriage November 2015 through the start of chemo August 2017 (21 months) she and her husband used no contraceptives and she did not get pregnant. Menarche: 39 years old [employer] P 0 LMP 04/2016 Contraceptive used for 1 year HRT n/a  Hysterectomy? no BSO? no   SOCIAL HISTORY: (updated 09/2019)  Monica Black is currently working part-time at [employer]. Her husband Monica Black works for [employer] doing analysis of [employer]. They attend [employer]. They are currently fostering 2 little girls they hope to adopt.    ADVANCED DIRECTIVES: In the absence of any documentation to the contrary, the patient's spouse is their HCPOA.   HEALTH MAINTENANCE: Social History   Tobacco Use  . Smoking status: Never Smoker  . Smokeless tobacco: Never Used  Substance Use Topics  . Alcohol use: No  . Drug use: No     Colonoscopy: n/a  PAP: 04/2018, negative  Bone density: 2019?   Allergies  Allergen Reactions  . Acetaminophen Itching  . Penicillins Hives    Current Outpatient Medications  Medication Sig Dispense Refill  . tamoxifen  (NOLVADEX) 20 MG tablet Take 20 mg by mouth daily.      No current facility-administered medications for this visit.    OBJECTIVE: white woman who appears stated age  39:   08/25/20 1325  BP: 106/71  Pulse: 76  Resp: 20  Temp: 99 F (37.2 C)  SpO2: 100%     Body mass index is 32.15 kg/m.   Wt Readings from Last 3 Encounters:  08/25/20 164 lb 9.6 oz (74.7 kg)  04/02/20 159 lb 3.2 oz (72.2 kg)  02/28/20 156 lb 8 oz (71 kg)      ECOG FS:1 - Symptomatic but completely ambulatory  Sclerae unicteric, EOMs intact Wearing a mask No cervical or supraclavicular adenopathy Lungs no rales or rhonchi Heart regular rate and rhythm Abd soft, nontender, positive bowel sounds MSK no focal spinal tenderness, no upper extremity lymphedema Neuro: nonfocal, well oriented, appropriate affect Breasts: Status post bilateral mastectomies and on the right also status post radiation.  No evidence of local recurrence.  Both axillae are benign.   LAB RESULTS:  CMP     Component Value Date/Time   NA 140 08/21/2020 1329   K 4.1  08/21/2020 1329   CL 106 08/21/2020 1329   CO2 25 08/21/2020 1329   GLUCOSE 117 (H) 08/21/2020 1329   BUN 13 08/21/2020 1329   CREATININE 0.83 08/21/2020 1329   CREATININE 0.90 10/01/2019 1530   CALCIUM 9.3 08/21/2020 1329   PROT 7.1 08/21/2020 1329   ALBUMIN 3.9 08/21/2020 1329   AST 21 08/21/2020 1329   AST 20 10/01/2019 1530   ALT 17 08/21/2020 1329   ALT 20 10/01/2019 1530   ALKPHOS 59 08/21/2020 1329   BILITOT 0.4 08/21/2020 1329   BILITOT 0.4 10/01/2019 1530   GFRNONAA >60 08/21/2020 1329   GFRNONAA >60 10/01/2019 1530   GFRAA >60 03/26/2020 1321   GFRAA >60 10/01/2019 1530    No results found for: TOTALPROTELP, ALBUMINELP, A1GS, A2GS, BETS, BETA2SER, GAMS, MSPIKE, SPEI  No results found for: KPAFRELGTCHN, LAMBDASER, KAPLAMBRATIO  Lab Results  Component Value Date   WBC 7.5 08/21/2020   NEUTROABS 4.5 08/21/2020   HGB 12.5 08/21/2020   HCT  36.4 08/21/2020   MCV 85.4 08/21/2020   PLT 337 08/21/2020   No results found for: LABCA2  No components found for: GUYQIH474  No results for input(s): INR in the last 168 hours.  No results found for: LABCA2  No results found for: QVZ563  No results found for: OVF643  No results found for: PIR518  No results found for: CA2729  No components found for: HGQUANT  No results found for: CEA1 / No results found for: CEA1   No results found for: AFPTUMOR  No results found for: CHROMOGRNA  No results found for: HGBA, HGBA2QUANT, HGBFQUANT, HGBSQUAN (Hemoglobinopathy evaluation)   No results found for: LDH  No results found for: IRON, TIBC, IRONPCTSAT (Iron and TIBC)  No results found for: FERRITIN  Urinalysis    Component Value Date/Time   COLORURINE STRAW (A) 05/01/2018 2012   APPEARANCEUR CLEAR (A) 05/01/2018 2012   LABSPEC 1.003 (L) 05/01/2018 2012   PHURINE 6.0 05/01/2018 2012   GLUCOSEU NEGATIVE 05/01/2018 2012   HGBUR SMALL (A) 05/01/2018 2012   [place] NEGATIVE 05/01/2018 2012   [place] NEGATIVE 05/01/2018 2012   [place] NEGATIVE 05/01/2018 2012   NITRITE NEGATIVE 05/01/2018 2012   LEUKOCYTESUR SMALL (A) 05/01/2018 2012    STUDIES: No results found.    ELIGIBLE FOR AVAILABLE RESEARCH PROTOCOL: no  ASSESSMENT: 39 y.o. [place], [place] woman status post right breast overlapping sites lumpectomy and sentinel lymph node biopsy June 2017 (and subsequent axillary lymph node dissection 05/18/2016) showing a pT3 pN2, stage IIIA invasive ductal carcinoma, grade 3, estrogen and progesterone receptor positive, HER-2 not amplified.  (a) a total of 21 axillary lymph nodes removed, 6 of them positive  (1) [neo]adjuvant chemotherapy consisted of dose dense cyclophosphamide and doxorubicin x4 followed by paclitaxel weekly x1, with the paclitaxel discontinued because of an infusion reaction  (a) docetaxel x3 Q 21 days completed 09/15/2016.  (2) status post  bilateral mastectomies and left sentinel lymph node sampling 10/08/2016 showing  (a) on the left no evidence of malignancy; single sentinel lymph node removed  (b) on the right, residual multifocal invasive ductal carcinoma, mpT1a, grade 3, with positive lymphovascular invasion but negative margins  (c) repeat prognostic panel: Estrogen receptor 100% positive, progesterone receptor 50% positive, HER-2 not amplified, MIB-1 was 20%  (3) adjuvant radiation given between January 2018 and 12/27/2016  (4) adjuvant capecitabine (2 weeks on, 1 week off) started January 2018 and completed 8 cycles 04/16/2017  (5) tamoxifen started January 2018  (a)  changed to letrozole/goserelin January 2019  (b) baseline DEXA scan May 2019 normal  (c) letrozole/goserelin discontinued 01/01/2020 at patient's preference  (6) negative genetics testing [[place] 2017]  through [facility] gene panel  (7) considering bilateral salpingo-oophorectomy  (8) considering denosumab or zoledronate  (a) restaging studies at [facility] 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  (9) tamoxifen resumed 01/01/2020   PLAN: Franchon is now close to 4 years out from definitive surgery for breast cancer with no evidence of disease recurrence.  This is very febrile.  She is tolerating tamoxifen well and the plan is to continue that a total of 5 years.  Her FSH is still low.  She clearly is not in menopause yet.  There is a possibility she might get pregnant.  This would be a blessing to her she says.  Meanwhile she has a 59-monthold  preemie that she is fostering and hopes to be able to adopt  We discussed diet and exercise issues at length today  I reassured her that the discomfort she is feeling on the  right axilla and right chest wall is secondary to the prior treatment.  It is not indicative of breast cancer recurrence.  She will see me again in a year.  Total encounter time 25 minutes.*Chauncey Cruel MD   08/25/2020 1:46 PM [facility] [place] [id number]. [phone]   Fax. [phone]  This document serves as a record of services personally performed by GLurline Del MD. It was created on his behalf by KWilburn Mylar a trained medical scribe. The creation of this record is based on the scribe's personal observations and the provider's statements to them.   I, GLurline DelMD, have reviewed the above documentation for accuracy and completeness, and I agree with the above.   *Total Encounter Time as defined by the Centers for Medicare and Medicaid Services includes, in addition to the face-to-face time of a patient visit (documented in the note above) non-face-to-face time: obtaining and reviewing outside history, ordering and reviewing medications, tests or procedures, care coordination (communications with other health care professionals or caregivers) and documentation in the medical record.

## 2020-08-25 ENCOUNTER — Inpatient Hospital Stay: Payer: Commercial Managed Care - PPO | Attending: Oncology | Admitting: Oncology

## 2020-08-25 ENCOUNTER — Other Ambulatory Visit: Payer: Self-pay

## 2020-08-25 VITALS — BP 106/71 | HR 76 | Temp 99.0°F | Resp 20 | Ht 60.0 in | Wt 164.6 lb

## 2020-08-25 DIAGNOSIS — Z170 Estrogen receptor positive status [ER+]: Secondary | ICD-10-CM | POA: Insufficient documentation

## 2020-08-25 DIAGNOSIS — Z9221 Personal history of antineoplastic chemotherapy: Secondary | ICD-10-CM | POA: Diagnosis not present

## 2020-08-25 DIAGNOSIS — C773 Secondary and unspecified malignant neoplasm of axilla and upper limb lymph nodes: Secondary | ICD-10-CM | POA: Insufficient documentation

## 2020-08-25 DIAGNOSIS — C50811 Malignant neoplasm of overlapping sites of right female breast: Secondary | ICD-10-CM | POA: Insufficient documentation

## 2020-08-25 DIAGNOSIS — Z923 Personal history of irradiation: Secondary | ICD-10-CM | POA: Insufficient documentation

## 2020-08-25 DIAGNOSIS — Z9013 Acquired absence of bilateral breasts and nipples: Secondary | ICD-10-CM | POA: Insufficient documentation

## 2020-08-29 LAB — ESTRADIOL, ULTRA SENS: Estradiol, Sensitive: 5.6 pg/mL

## 2020-12-15 ENCOUNTER — Encounter: Payer: Commercial Managed Care - PPO | Admitting: Certified Nurse Midwife

## 2020-12-22 ENCOUNTER — Ambulatory Visit (INDEPENDENT_AMBULATORY_CARE_PROVIDER_SITE_OTHER): Payer: Commercial Managed Care - PPO | Admitting: Certified Nurse Midwife

## 2020-12-22 ENCOUNTER — Other Ambulatory Visit: Payer: Self-pay

## 2020-12-22 ENCOUNTER — Encounter: Payer: Self-pay | Admitting: Certified Nurse Midwife

## 2020-12-22 VITALS — BP 119/78 | HR 73 | Wt 171.2 lb

## 2020-12-22 DIAGNOSIS — R102 Pelvic and perineal pain: Secondary | ICD-10-CM

## 2020-12-22 NOTE — Patient Instructions (Signed)
Pelvic Pain, Female Pelvic pain is pain in your lower belly (abdomen), below your belly button and between your hips. The pain may start suddenly (be acute), keep coming back (be recurring), or last a long time (become chronic). Pelvic pain that lasts longer than 6 months is called chronic pelvic pain. There are many causes of pelvic pain. Sometimes the cause of pelvic pain is not known. Follow these instructions at home:  Take over-the-counter and prescription medicines only as told by your doctor.  Rest as told by your doctor.  Do not have sex if it hurts.  Keep a journal of your pelvic pain. Write down: ? When the pain started. ? Where the pain is located. ? What seems to make the pain better or worse, such as food or your period (menstrual cycle). ? Any symptoms you have along with the pain.  Keep all follow-up visits as told by your doctor. This is important.   Contact a doctor if:  Medicine does not help your pain.  Your pain comes back.  You have new symptoms.  You have unusual discharge or bleeding from your vagina.  You have a fever or chills.  You are having trouble pooping (constipation).  You have blood in your pee (urine) or poop (stool).  Your pee smells bad.  You feel weak or light-headed. Get help right away if:  You have sudden pain that is very bad.  Your pain keeps getting worse.  You have very bad pain and also have any of these symptoms: ? A fever. ? Feeling sick to your stomach (nausea). ? Throwing up (vomiting). ? Being very sweaty.  You pass out (lose consciousness). Summary  Pelvic pain is pain in your lower belly (abdomen), below your belly button and between your hips.  There are many possible causes of pelvic pain.  Keep a journal of your pelvic pain. This information is not intended to replace advice given to you by your health care provider. Make sure you discuss any questions you have with your health care provider. Document  Revised: 03/29/2018 Document Reviewed: 03/29/2018 Elsevier Patient Education  [place].

## 2020-12-22 NOTE — Progress Notes (Signed)
GYN ENCOUNTER NOTE  Subjective:       Monica Black is a 40 y.o. G0P0000 female is here for gynecologic evaluation of the following issues:  1. Pelvic pain and lower back pain for the past few months. Pt has history of breast cancer. She had a double mastectomy in 2017. She states she has had a change in her medications from Zoladex to tamoxifen. Since then she has had bloating , pelvic pain, and lower back discomfort. She has not had a period since starting her medications for the breast cancer. She states her last period was probably in 2015. She has had hx of abnormal pap that she had a colposcopy with. Her last pap smear in 2019 was normal. She has history of ovarian cyst that ruptured. She denies history of fibroids.  Family history significant for cancer on her father side ( grandfather -lung & brain cancer). Her mother is adopted.    Gynecologic History No LMP recorded. (Menstrual status: Irregular Periods). Contraception: none Last Pap:2019. Results were: normal per pt  Last mammogram:double mastectomy 2017  Obstetric History OB History  Gravida Para Term Preterm AB Living  0 0 0 0 0 0  SAB IAB Ectopic Multiple Live Births  0 0 0 0 0  Obstetric Comments  LMP: 07/2014    Past Medical History:  Diagnosis Date  . Breast cancer [facility])     Past Surgical History:  Procedure Laterality Date  . BREAST LUMPECTOMY Right   . MASTECTOMY, RADICAL      Current Outpatient Medications on File Prior to Visit  Medication Sig Dispense Refill  . tamoxifen (NOLVADEX) 20 MG tablet Take 20 mg by mouth daily.      No current facility-administered medications on file prior to visit.    Allergies  Allergen Reactions  . Acetaminophen Itching  . Penicillins Hives    Social History   Socioeconomic History  . Marital status: Married    Spouse name: Not on file  . Number of children: Not on file  . Years of education: Not on file  . Highest education level: Not on file  Occupational  History  . Not on file  Tobacco Use  . Smoking status: Never Smoker  . Smokeless tobacco: Never Used  Substance and Sexual Activity  . Alcohol use: No  . Drug use: No  . Sexual activity: Not on file  Other Topics Concern  . Not on file  Social History Narrative   Married.   No children.   Will be working with [employer] through the [employer].   Enjoys shopping, movies, yoga.     Social Determinants of Health   Financial Resource Strain: Not on file  Food Insecurity: Not on file  Transportation Needs: Not on file  Physical Activity: Not on file  Stress: Not on file  Social Connections: Not on file  Intimate Partner Violence: Not on file    Family History  Problem Relation Age of Onset  . Other Mother        adopted, family history unk  . Asthma Father   . Brain cancer Paternal Grandfather   . Parkinson's disease Paternal Uncle   . Breast cancer Neg Hx   . Ovarian cancer Neg Hx     The following portions of the patient's history were reviewed and updated as appropriate: allergies, current medications, past family history, past medical history, past social history, past surgical history and problem list.  Review of Systems Review of  Systems - Negative except as mentioned in HPI Review of Systems - General ROS: negative for - chills, fatigue, fever, hot flashes, malaise or night sweats Hematological and Lymphatic ROS: negative for - bleeding problems or swollen lymph nodes Gastrointestinal ROS: negative for - abdominal pain, blood in stools, change in bowel habits and nausea/vomiting Musculoskeletal ROS: negative for - joint pain, muscle pain or muscular weakness Genito-Urinary ROS: negative for - change in menstrual cycle, dysmenorrhea, dyspareunia, dysuria, genital discharge, genital ulcers, hematuria, incontinence, irregular/heavy menses, nocturia. Positive for pelvic pain , bloating, lower back pain .   Objective:   BP 119/78   Pulse 73   Wt 171  lb 3.2 oz (77.7 kg)   BMI 33.44 kg/m  CONSTITUTIONAL: Well-developed, well-nourished female in no acute distress.  HENT:  Normocephalic, atraumatic.  NECK: Normal range of motion, supple, no masses.  Normal thyroid.  SKIN: Skin is warm and dry. No rash noted. Not diaphoretic. No erythema. No pallor. [place]: Alert and oriented to person, place, and time. PSYCHIATRIC: Normal mood and affect. Normal behavior. Normal judgment and thought content. CARDIOVASCULAR:Not Examined RESPIRATORY: Not Examined BREASTS: Not Examined ABDOMEN: Soft, non distended; Non tender.  No Organomegaly. PELVIC:  External Genitalia: Normal  BUS: Normal  Vagina: Normal, mild redness, pt declines swab for BV and yeast  Cervix: Normal  Uterus:  Size enlarged, shape,consistency, mobile  Adnexa: Normal  RV: Normal   Bladder: Nontender MUSCULOSKELETAL: Normal range of motion. No tenderness.  No cyanosis, clubbing, or edema.     Assessment:   Pelvic pain    Plan:   Pelvic u/s ordered , Will follow up with results. Follow up 1-2 wks for annual physical exam.   Philip Aspen, CNM

## 2020-12-31 ENCOUNTER — Ambulatory Visit: Payer: Commercial Managed Care - PPO

## 2021-01-02 ENCOUNTER — Telehealth: Payer: Self-pay | Admitting: Certified Nurse Midwife

## 2021-01-02 ENCOUNTER — Other Ambulatory Visit: Payer: Self-pay

## 2021-01-02 ENCOUNTER — Ambulatory Visit
Admission: RE | Admit: 2021-01-02 | Discharge: 2021-01-02 | Disposition: A | Discharge status: Home / Self Care | Payer: Commercial Managed Care - PPO | Source: Ambulatory Visit | Attending: Certified Nurse Midwife | Admitting: Certified Nurse Midwife

## 2021-01-02 DIAGNOSIS — R102 Pelvic and perineal pain: Secondary | ICD-10-CM | POA: Diagnosis present

## 2021-01-02 NOTE — Telephone Encounter (Signed)
New message   Ultrasound appt today @ 10:30am   Calling ultrasounds test results

## 2021-01-02 NOTE — Telephone Encounter (Signed)
Pt aware it may take up to 48 hours before the ultrasound is back. Pt voiced understanding.

## 2021-03-10 ENCOUNTER — Ambulatory Visit (INDEPENDENT_AMBULATORY_CARE_PROVIDER_SITE_OTHER): Payer: Commercial Managed Care - PPO | Admitting: Primary Care

## 2021-03-10 ENCOUNTER — Other Ambulatory Visit: Payer: Self-pay

## 2021-03-10 ENCOUNTER — Encounter: Payer: Self-pay | Admitting: Primary Care

## 2021-03-10 VITALS — BP 110/82 | HR 81 | Temp 97.6°F | Ht 60.0 in | Wt 170.0 lb

## 2021-03-10 DIAGNOSIS — Z1159 Encounter for screening for other viral diseases: Secondary | ICD-10-CM

## 2021-03-10 DIAGNOSIS — Z114 Encounter for screening for human immunodeficiency virus [HIV]: Secondary | ICD-10-CM | POA: Diagnosis not present

## 2021-03-10 DIAGNOSIS — Z853 Personal history of malignant neoplasm of breast: Secondary | ICD-10-CM | POA: Diagnosis not present

## 2021-03-10 DIAGNOSIS — Z0000 Encounter for general adult medical examination without abnormal findings: Secondary | ICD-10-CM

## 2021-03-10 LAB — COMPREHENSIVE METABOLIC PANEL
ALT: 27 U/L (ref 0–35)
AST: 25 U/L (ref 0–37)
Albumin: 4.2 g/dL (ref 3.5–5.2)
Alkaline Phosphatase: 51 U/L (ref 39–117)
BUN: 11 mg/dL (ref 6–23)
CO2: 29 mEq/L (ref 19–32)
Calcium: 8.9 mg/dL (ref 8.4–10.5)
Chloride: 104 mEq/L (ref 96–112)
Creatinine, Ser: 0.83 mg/dL (ref 0.40–1.20)
GFR: 88.23 mL/min (ref >60.00)
Glucose, Bld: 104 mg/dL — ABNORMAL HIGH (ref 70–99)
Potassium: 3.9 mEq/L (ref 3.5–5.1)
Sodium: 140 mEq/L (ref 135–145)
Total Bilirubin: 0.3 mg/dL (ref 0.2–1.2)
Total Protein: 6.9 g/dL (ref 6.0–8.3)

## 2021-03-10 LAB — LIPID PANEL
Cholesterol: 215 mg/dL — ABNORMAL HIGH (ref 0–200)
HDL: 53.9 mg/dL (ref >39.00)
LDL Cholesterol: 135 mg/dL — ABNORMAL HIGH (ref 0–99)
NonHDL: 160.7
Total CHOL/HDL Ratio: 4
Triglycerides: 131 mg/dL (ref 0.0–149.0)
VLDL: 26.2 mg/dL (ref 0.0–40.0)

## 2021-03-10 LAB — CBC
HCT: 36.7 % (ref 36.0–46.0)
Hemoglobin: 12.8 g/dL (ref 12.0–15.0)
MCHC: 34.8 g/dL (ref 30.0–36.0)
MCV: 86.6 fl (ref 78.0–100.0)
Platelets: 284 10*3/uL (ref 150.0–400.0)
RBC: 4.23 Mil/uL (ref 3.87–5.11)
RDW: 12.7 % (ref 11.5–15.5)
WBC: 5.5 10*3/uL (ref 4.0–10.5)

## 2021-03-10 NOTE — Patient Instructions (Signed)
Stop by the lab prior to leaving today. I will notify you of your results once received.   It was a pleasure to see you today!   Preventive Care 40-40 Years Old, Female Preventive care refers to lifestyle choices and visits with your health care provider that can promote health and wellness. This includes:  A yearly physical exam. This is also called an annual wellness visit.  Regular dental and eye exams.  Immunizations.  Screening for certain conditions.  Healthy lifestyle choices, such as: ? Eating a healthy diet. ? Getting regular exercise. ? Not using drugs or products that contain nicotine and tobacco. ? Limiting alcohol use. What can I expect for my preventive care visit? Physical exam Your health care provider will check your:  Height and weight. These may be used to calculate your BMI (body mass index). BMI is a measurement that tells if you are at a healthy weight.  Heart rate and blood pressure.  Body temperature.  Skin for abnormal spots. Counseling Your health care provider may ask you questions about your:  Past medical problems.  Family's medical history.  Alcohol, tobacco, and drug use.  Emotional well-being.  Home life and relationship well-being.  Sexual activity.  Diet, exercise, and sleep habits.  Work and work environment.  Access to firearms.  Method of birth control.  Menstrual cycle.  Pregnancy history. What immunizations do I need? Vaccines are usually given at various ages, according to a schedule. Your health care provider will recommend vaccines for you based on your age, medical history, and lifestyle or other factors, such as travel or where you work.   What tests do I need? Blood tests  Lipid and cholesterol levels. These may be checked every 5 years, or more often if you are over 50 years old.  Hepatitis C test.  Hepatitis B test. Screening  Lung cancer screening. You may have this screening every year starting at  age 55 if you have a 30-pack-year history of smoking and currently smoke or have quit within the past 15 years.  Colorectal cancer screening. ? All adults should have this screening starting at age 50 and continuing until age 75. ? Your health care provider may recommend screening at age 45 if you are at increased risk. ? You will have tests every 1-10 years, depending on your results and the type of screening test.  Diabetes screening. ? This is done by checking your blood sugar (glucose) after you have not eaten for a while (fasting). ? You may have this done every 1-3 years.  Mammogram. ? This may be done every 1-2 years. ? Talk with your health care provider about when you should start having regular mammograms. This may depend on whether you have a family history of breast cancer.  BRCA-related cancer screening. This may be done if you have a family history of breast, ovarian, tubal, or peritoneal cancers.  Pelvic exam and Pap test. ? This may be done every 3 years starting at age 21. ? Starting at age 30, this may be done every 5 years if you have a Pap test in combination with an HPV test. Other tests  STD (sexually transmitted disease) testing, if you are at risk.  Bone density scan. This is done to screen for osteoporosis. You may have this scan if you are at high risk for osteoporosis. Talk with your health care provider about your test results, treatment options, and if necessary, the need for more tests. Follow these instructions   at home: Eating and drinking  Eat a diet that includes fresh fruits and vegetables, whole grains, lean protein, and low-fat dairy products.  Take vitamin and mineral supplements as recommended by your health care provider.  Do not drink alcohol if: ? Your health care provider tells you not to drink. ? You are pregnant, may be pregnant, or are planning to become pregnant.  If you drink alcohol: ? Limit how much you have to 0-1 drink a  day. ? Be aware of how much alcohol is in your drink. In the [place] one drink equals one 12 oz bottle of beer (355 mL), one 5 oz glass of wine (148 mL), or one 1 oz glass of hard liquor (44 mL).   Lifestyle  Take daily care of your teeth and gums. Brush your teeth every morning and night with fluoride toothpaste. Floss one time each day.  Stay active. Exercise for at least 30 minutes 5 or more days each week.  Do not use any products that contain nicotine or tobacco, such as cigarettes, e-cigarettes, and chewing tobacco. If you need help quitting, ask your health care provider.  Do not use drugs.  If you are sexually active, practice safe sex. Use a condom or other form of protection to prevent STIs (sexually transmitted infections).  If you do not wish to become pregnant, use a form of birth control. If you plan to become pregnant, see your health care provider for a prepregnancy visit.  If told by your health care provider, take low-dose aspirin daily starting at age 50.  Find healthy ways to cope with stress, such as: ? Meditation, yoga, or listening to music. ? Journaling. ? Talking to a trusted person. ? Spending time with friends and family. Safety  Always wear your seat belt while driving or riding in a vehicle.  Do not drive: ? If you have been drinking alcohol. Do not ride with someone who has been drinking. ? When you are tired or distracted. ? While texting.  Wear a helmet and other protective equipment during sports activities.  If you have firearms in your house, make sure you follow all gun safety procedures. What's next?  Visit your health care provider once a year for an annual wellness visit.  Ask your health care provider how often you should have your eyes and teeth checked.  Stay up to date on all vaccines. This information is not intended to replace advice given to you by your health care provider. Make sure you discuss any questions you have with your  health care provider. Document Revised: 07/15/2020 Document Reviewed: 06/22/2018 Elsevier Patient Education  2021 Elsevier Inc.  

## 2021-03-10 NOTE — Assessment & Plan Note (Signed)
Compliant to Tamoxifen 20 mg daily per oncology. History of double mastectomy, no recent MRI. She will see her oncologist in November 2022.

## 2021-03-10 NOTE — Assessment & Plan Note (Signed)
Declines all vaccines.  Pap smear due, she opts to follow with GYN.   Discussed the importance of a healthy diet and regular exercise in order for weight loss, and to reduce the risk of any potential medical problems.  Exam today stable. Labs pending.

## 2021-03-10 NOTE — Progress Notes (Signed)
Subjective:    Patient ID: Monica Black, female    DOB: January 30, 1981, 40 y.o.   MRN: [id number]  HPI  Monica Black is a very pleasant 40 y.o. female who presents today for complete physical.  Immunizations: -Tetanus: Declines -Influenza: Declines -Covid-19: Declines   Diet: She endorses a fair diet.  Exercise: She is not exercising much, occasional walk.  Eye exam: Completed in 2022 Dental exam: Due, has scheduled for July 2022  Pap Smear: Completed in 2019, follows with GYN.  Mammogram: Double mastectomy, follows with oncology   BP Readings from Last 3 Encounters:  03/10/21 110/82  12/22/20 119/78  08/25/20 106/71       Review of Systems  Constitutional: Negative for unexpected weight change.  HENT: Negative for rhinorrhea.   Respiratory: Negative for cough and shortness of breath.   Cardiovascular: Negative for chest pain.  Gastrointestinal: Negative for constipation and diarrhea.  Genitourinary: Negative for difficulty urinating and menstrual problem.  Musculoskeletal: Negative for arthralgias and myalgias.  Skin: Negative for rash.  Allergic/Immunologic: Negative for environmental allergies.  Neurological: Negative for dizziness, numbness and headaches.         Past Medical History:  Diagnosis Date  . Breast cancer [facility])     Social History   Socioeconomic History  . Marital status: Married    Spouse name: Not on file  . Number of children: Not on file  . Years of education: Not on file  . Highest education level: Not on file  Occupational History  . Not on file  Tobacco Use  . Smoking status: Never Smoker  . Smokeless tobacco: Never Used  Substance and Sexual Activity  . Alcohol use: No  . Drug use: No  . Sexual activity: Not on file  Other Topics Concern  . Not on file  Social History Narrative   Married.   No children.   Will be working with [employer] through the [employer].   Enjoys shopping, movies, yoga.      Social Determinants of Health   Financial Resource Strain: Not on file  Food Insecurity: Not on file  Transportation Needs: Not on file  Physical Activity: Not on file  Stress: Not on file  Social Connections: Not on file  Intimate Partner Violence: Not on file    Past Surgical History:  Procedure Laterality Date  . BREAST LUMPECTOMY Right   . MASTECTOMY, RADICAL      Family History  Problem Relation Age of Onset  . Other Mother        adopted, family history unk  . Asthma Father   . Brain cancer Paternal Grandfather   . Parkinson's disease Paternal Uncle   . Breast cancer Neg Hx   . Ovarian cancer Neg Hx     Allergies  Allergen Reactions  . Acetaminophen Itching  . Penicillins Hives    Current Outpatient Medications on File Prior to Visit  Medication Sig Dispense Refill  . tamoxifen (NOLVADEX) 20 MG tablet Take 20 mg by mouth daily.      No current facility-administered medications on file prior to visit.    BP 110/82   Pulse 81   Temp 97.6 F (36.4 C) (Temporal)   Ht 5' (1.524 m)   Wt 170 lb (77.1 kg)   SpO2 98%   BMI 33.20 kg/m  Objective:   Physical Exam HENT:     Right Ear: Tympanic membrane and ear canal normal.     Left Ear: Tympanic membrane  and ear canal normal.     Nose: Nose normal.  Eyes:     Conjunctiva/sclera: Conjunctivae normal.     Pupils: Pupils are equal, round, and reactive to light.  Neck:     Thyroid: No thyromegaly.  Cardiovascular:     Rate and Rhythm: Normal rate and regular rhythm.     Heart sounds: No murmur heard.   Pulmonary:     Effort: Pulmonary effort is normal.     Breath sounds: Normal breath sounds. No rales.  Abdominal:     General: Bowel sounds are normal.     Palpations: Abdomen is soft.     Tenderness: There is no abdominal tenderness.  Musculoskeletal:        General: Normal range of motion.     Cervical back: Neck supple.  Lymphadenopathy:     Cervical: No cervical adenopathy.  Skin:     General: Skin is warm and dry.     Findings: No rash.  Neurological:     Mental Status: She is alert and oriented to person, place, and time.     Cranial Nerves: No cranial nerve deficit.     Deep Tendon Reflexes: Reflexes are normal and symmetric.  Psychiatric:        Mood and Affect: Mood normal.           Assessment & Plan:      This visit occurred during the SARS-CoV-2 public health emergency.  Safety protocols were in place, including screening questions prior to the visit, additional usage of staff PPE, and extensive cleaning of exam room while observing appropriate contact time as indicated for disinfecting solutions.

## 2021-03-11 ENCOUNTER — Other Ambulatory Visit (INDEPENDENT_AMBULATORY_CARE_PROVIDER_SITE_OTHER): Payer: Commercial Managed Care - PPO

## 2021-03-11 DIAGNOSIS — R739 Hyperglycemia, unspecified: Secondary | ICD-10-CM

## 2021-03-11 LAB — HEPATITIS C ANTIBODY
Hepatitis C Ab: NONREACTIVE
SIGNAL TO CUT-OFF: 0.01 (ref <1.00)

## 2021-03-11 LAB — HIV ANTIBODY (ROUTINE TESTING W REFLEX): HIV 1&2 Ab, 4th Generation: NONREACTIVE

## 2021-03-11 LAB — HEMOGLOBIN A1C: Hgb A1c MFr Bld: 5.7 % (ref 4.6–6.5)

## 2021-03-13 ENCOUNTER — Other Ambulatory Visit: Payer: Self-pay | Admitting: Oncology

## 2021-06-09 ENCOUNTER — Other Ambulatory Visit: Payer: Self-pay | Admitting: Oncology

## 2021-08-24 NOTE — Progress Notes (Signed)
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   She continues on tamoxifen.  She tolerates this generally well, and hot flashes and vaginal dryness are not a major issue.  She does have some eye problems which she thinks might be related and sometimes she feels moody.  She also tells me her gynecologist found that the endometrial stripe was a little bit thicker than it should.  She says that she was tested to see if she was menopausal and she is not yet menopausal.  I do not have that data  REVIEW OF SYSTEMS: Bayla continues to be frustrated with DSS.  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  At some point they do hope to adopt.  In the meantime they continue to foster children and of course the main purpose there is to help the children which they certainly are doing.   COVID 19 VACCINATION STATUS: Refuses vaccination   HISTORY OF CURRENT ILLNESS: From the original intake note:  Monica Black has a history of breast cancer dating back to 2017. She moved from [place] to [place] in 07/2017 and transferred her care to [facility] in [place], [place].   She had screening mammogram in June 2017 showing microcalcifications in the right breast. She proceeded to biopsy that month that showed by her recollection "stage 0"  cancer. She tells me she had a PET scan at that time which did not show any adenopathy. 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   She was subsequently treated with chemotherapy consisting of dose dense AC x4 cycles. She then received one dose of weekly taxol, but she had an infusion reaction. She was switched to taxotere and completed 3 cycles given 21 days apart on 09/15/2016.  She opted to undergo bilateral mastectomies with left sentinel lymph node biopsy on 10/08/2016 but decided against immediate DIEP reconstruction.. Final pathology [id number]) revealed right breast multifocal residual invasive ductal carcinoma, with 2 foci measuring 0.5 cm each, grade 3; lymphovascular invasion present; margins negative. Left breast and the single left sentinel lymph node were negative for malignancy.   She was then placed on tamoxifen, as well as Xeloda for 2 weeks on and 1 week off, in 10/2016. She received concurrent radiation therapy from 10/2016 through 12/27/2016. She completed 8 cycles of Xeloda on 04/16/2017.  Genetic testing was also performed while she was in [place], which showed no pathogenic mutations.  Although her periods never recurred, she was switched to Zoladex/letrozole on 10/11/2017. She is tolerating this well.  The patient's subsequent history is as detailed below.   PAST MEDICAL HISTORY: Past Medical History:  Diagnosis Date   Breast  cancer [facility])     PAST SURGICAL HISTORY: Past Surgical History:  Procedure Laterality Date   BREAST LUMPECTOMY Right    MASTECTOMY, RADICAL    Status post left elbow surgery   FAMILY HISTORY: Family History  Problem Relation Age  of Onset   Other Mother        adopted, family history unk   Asthma Father    Brain cancer Paternal Grandfather    Parkinson's disease Paternal Uncle    Breast cancer Neg Hx    Ovarian cancer Neg Hx   The patient's mother was adopted and has no information regarding her biologic family. She is 40 years old as of December 2020. The patient's father is 56 years old as of December 2020. He has a history of asthma. His father had brain cancer. The patient's father had 1 brother, with Parkinson's disease, and 2 sisters, 1 of whom died at a young age. There is no history of breast or ovarian cancer on his side of the family. The patient himself has 2 brothers, aged 57 and 70 as of December 2020. She is not aware of any other cancer history in the family   GYNECOLOGIC HISTORY:  The patient thinks her last menstrual period was around October 2015. Her periods had been scant and irregular prior to that. From her marriage November 2015 through the start of chemo August 2017 (21 months) she and her husband used no contraceptives and she did not get pregnant. Menarche: 40 years old [place] P 0 LMP 04/2016 Contraceptive used for 1 year HRT n/a  Hysterectomy? no BSO? no   SOCIAL HISTORY: (updated 09/2019)  Monica Black is currently working part-time at [employer]. Her husband Monica Black works for [employer] doing analysis of [employer]. They attend [employer]    ADVANCED DIRECTIVES: In the absence of any documentation to the contrary, the patient's spouse is their HCPOA.   HEALTH MAINTENANCE: Social History   Tobacco Use   Smoking status: Never   Smokeless tobacco: Never  Substance Use Topics   Alcohol use: No   Drug use: No     Colonoscopy: n/a  PAP: 04/2018, negative  Bone density: 2019?   Allergies  Allergen Reactions   Acetaminophen Itching   Penicillins Hives    Current Outpatient Medications  Medication Sig Dispense Refill   tamoxifen (NOLVADEX) 20 MG tablet TAKE 1  TABLET BY MOUTH EVERY DAY 90 tablet 0   No current facility-administered medications for this visit.    OBJECTIVE: white woman in no acute distress  Vitals:   08/25/21 1159  BP: 124/77  Pulse: 75  Resp: 16  Temp: 98.2 F (36.8 C)  SpO2: 96%      Body mass index is 31.78 kg/m.   Wt Readings from Last 3 Encounters:  08/25/21 162 lb 11.2 oz (73.8 kg)  03/10/21 170 lb (77.1 kg)  12/22/20 171 lb 3.2 oz (77.7 kg)      ECOG FS:1 - Symptomatic but completely ambulatory  Sclerae unicteric, EOMs intact Wearing a mask No cervical or supraclavicular adenopathy Lungs no rales or rhonchi Heart regular rate and rhythm Abd soft, nontender, positive bowel sounds MSK no focal spinal tenderness, no upper extremity lymphedema Neuro: nonfocal, well oriented, appropriate affect Breasts: Status post bilateral mastectomies.  Also status post radiation to the right chest wall.  There is no evidence of local recurrence.  Both axillae are benign.   LAB RESULTS:  CMP     Component Value Date/Time  NA 139 08/25/2021 1148   K 4.1 08/25/2021 1148   CL 105 08/25/2021 1148   CO2 27 08/25/2021 1148   GLUCOSE 114 (H) 08/25/2021 1148   BUN 10 08/25/2021 1148   CREATININE 0.89 08/25/2021 1148   CREATININE 0.90 10/01/2019 1530   CALCIUM 8.9 08/25/2021 1148   PROT 7.1 08/25/2021 1148   ALBUMIN 3.9 08/25/2021 1148   AST 22 08/25/2021 1148   AST 20 10/01/2019 1530   ALT 24 08/25/2021 1148   ALT 20 10/01/2019 1530   ALKPHOS 49 08/25/2021 1148   BILITOT 0.5 08/25/2021 1148   BILITOT 0.4 10/01/2019 1530   GFRNONAA >60 08/25/2021 1148   GFRNONAA >60 10/01/2019 1530   GFRAA >60 03/26/2020 1321   GFRAA >60 10/01/2019 1530    No results found for: TOTALPROTELP, ALBUMINELP, A1GS, A2GS, BETS, BETA2SER, GAMS, MSPIKE, SPEI  No results found for: KPAFRELGTCHN, LAMBDASER, KAPLAMBRATIO  Lab Results  Component Value Date   WBC 6.2 08/25/2021   NEUTROABS 2.2 08/25/2021   HGB 12.2 08/25/2021   HCT  35.4 (L) 08/25/2021   MCV 88.7 08/25/2021   PLT 317 08/25/2021   No results found for: LABCA2  No components found for: DJSHFW263  No results for input(s): INR in the last 168 hours.  No results found for: LABCA2  No results found for: ZCH885  No results found for: OYD741  No results found for: OIN867  No results found for: CA2729  No components found for: HGQUANT  No results found for: CEA1 / No results found for: CEA1   No results found for: AFPTUMOR  No results found for: CHROMOGRNA  No results found for: HGBA, HGBA2QUANT, HGBFQUANT, HGBSQUAN (Hemoglobinopathy evaluation)   No results found for: LDH  No results found for: IRON, TIBC, IRONPCTSAT (Iron and TIBC)  No results found for: FERRITIN  Urinalysis    Component Value Date/Time   COLORURINE STRAW (A) 05/01/2018 2012   APPEARANCEUR CLEAR (A) 05/01/2018 2012   LABSPEC 1.003 (L) 05/01/2018 2012   PHURINE 6.0 05/01/2018 2012   GLUCOSEU NEGATIVE 05/01/2018 2012   HGBUR SMALL (A) 05/01/2018 2012   [place] NEGATIVE 05/01/2018 2012   [place] NEGATIVE 05/01/2018 2012   [place] NEGATIVE 05/01/2018 2012   NITRITE NEGATIVE 05/01/2018 2012   LEUKOCYTESUR SMALL (A) 05/01/2018 2012    STUDIES: No results found.    ELIGIBLE FOR AVAILABLE RESEARCH PROTOCOL: no  ASSESSMENT: 40 y.o. [place], [place] woman status post right breast overlapping sites lumpectomy and sentinel lymph node biopsy June 2017 (and subsequent axillary lymph node dissection 05/18/2016) showing a pT3 pN2, stage IIIA invasive ductal carcinoma, grade 3, estrogen and progesterone receptor positive, HER-2 not amplified.  (a) a total of 21 axillary lymph nodes removed, 6 of them positive  (1) [neo]adjuvant chemotherapy consisted of dose dense cyclophosphamide and doxorubicin x4 followed by paclitaxel weekly x1, with the paclitaxel discontinued because of an infusion reaction  (a) docetaxel x3 Q 21 days completed 09/15/2016.  (2) status post  bilateral mastectomies and left sentinel lymph node sampling 10/08/2016 showing  (a) on the left no evidence of malignancy; single sentinel lymph node removed  (b) on the right, residual multifocal invasive ductal carcinoma, mpT1a, grade 3, with positive lymphovascular invasion but negative margins  (c) repeat prognostic panel: Estrogen receptor 100% positive, progesterone receptor 50% positive, HER-2 not amplified, MIB-1 was 20%  (3) adjuvant radiation given between January 2018 and 12/27/2016  (4) adjuvant capecitabine (2 weeks on, 1 week off) started January 2018 and completed 8 cycles  04/16/2017  (5) tamoxifen started January 2018  (a) changed to letrozole/goserelin January 2019  (b) baseline DEXA scan May 2019 normal  (c) letrozole/goserelin discontinued 01/01/2020 at patient's preference  (6) negative genetics testing [[place] 2017]  through [facility] gene panel  (7) considering bilateral salpingo-oophorectomy  (8) considering denosumab or zoledronate  (a) restaging studies at [facility] 03/22/2019 included brain MRI, bone scan and CT scans of the chest abdomen and pelvis showed no evidence of metastatic disease but there was a new right anterior fifth rib fracture and 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  (9) tamoxifen resumed 01/01/2020   PLAN: Kerstyn is coming up on 5 years from definitive surgery for her breast cancer with no evidence of disease recurrence.  This is very favorable.  She will complete 5 years of tamoxifen in January.  Given that she had significantly advanced disease, stage III, my recommendation is that she continue tamoxifen and additional 5years.  She understands that most of the benefit of tamoxifen is accrued in the first 5 years nevertheless in cases like hers I think a 3% or perhaps more risk reduction can be expected from continuing tamoxifen to a total of 10 years.  Of course there are risks  regarding clots and endometrial cancer and she will need follow-up and specifically for the latter.  She would like to stop tamoxifen.  She is willing to continue it a little bit longer perhaps.  She is going to meet one of my partners in 6 months since I will be retiring and she can have this discussion again with him at that time.  For now however I am refilling that prescription for her  I wish her all the best in terms of their eventually adopting.  Total encounter time 25 minutes.Chauncey Cruel, MD   08/25/2021 1:03 PM Medical Oncology and Hematology [facility] [place], [place] [address] Tel. [phone]    Fax. [phone]   This document serves as a record of services personally performed by Lurline Del, MD. It was created on his behalf by Wilburn Mylar, a trained medical scribe. The creation of this record is based on the scribe's personal observations and the provider's statements to them.   I, Lurline Del MD, have reviewed the above documentation for accuracy and completeness, and I agree with the above.   *Total Encounter Time as defined by the Centers for Medicare and Medicaid Services includes, in addition to the face-to-face time of a patient visit (documented in the note above) non-face-to-face time: obtaining and reviewing outside history, ordering and reviewing medications, tests or procedures, care coordination (communications with other health care professionals or caregivers) and documentation in the medical record.

## 2021-08-25 ENCOUNTER — Ambulatory Visit: Payer: Commercial Managed Care - PPO | Admitting: Oncology

## 2021-08-25 ENCOUNTER — Other Ambulatory Visit: Payer: Self-pay

## 2021-08-25 ENCOUNTER — Inpatient Hospital Stay (HOSPITAL_BASED_OUTPATIENT_CLINIC_OR_DEPARTMENT_OTHER): Payer: Commercial Managed Care - PPO | Admitting: Oncology

## 2021-08-25 ENCOUNTER — Other Ambulatory Visit: Payer: Commercial Managed Care - PPO

## 2021-08-25 ENCOUNTER — Inpatient Hospital Stay: Payer: Commercial Managed Care - PPO | Attending: Oncology

## 2021-08-25 VITALS — BP 124/77 | HR 75 | Temp 98.2°F | Resp 16 | Ht 60.0 in | Wt 162.7 lb

## 2021-08-25 DIAGNOSIS — Z853 Personal history of malignant neoplasm of breast: Secondary | ICD-10-CM | POA: Diagnosis not present

## 2021-08-25 DIAGNOSIS — Z79810 Long term (current) use of selective estrogen receptor modulators (SERMs): Secondary | ICD-10-CM | POA: Insufficient documentation

## 2021-08-25 DIAGNOSIS — Z170 Estrogen receptor positive status [ER+]: Secondary | ICD-10-CM | POA: Diagnosis not present

## 2021-08-25 DIAGNOSIS — C50811 Malignant neoplasm of overlapping sites of right female breast: Secondary | ICD-10-CM | POA: Diagnosis present

## 2021-08-25 DIAGNOSIS — Z9221 Personal history of antineoplastic chemotherapy: Secondary | ICD-10-CM | POA: Insufficient documentation

## 2021-08-25 DIAGNOSIS — Z9013 Acquired absence of bilateral breasts and nipples: Secondary | ICD-10-CM | POA: Diagnosis not present

## 2021-08-25 DIAGNOSIS — Z923 Personal history of irradiation: Secondary | ICD-10-CM | POA: Diagnosis not present

## 2021-08-25 LAB — CBC WITH DIFFERENTIAL/PLATELET
Abs Immature Granulocytes: 0.01 10*3/uL (ref 0.00–0.07)
Basophils Absolute: 0.1 10*3/uL (ref 0.0–0.1)
Basophils Relative: 1 %
Eosinophils Absolute: 0.1 10*3/uL (ref 0.0–0.5)
Eosinophils Relative: 2 %
HCT: 35.4 % — ABNORMAL LOW (ref 36.0–46.0)
Hemoglobin: 12.2 g/dL (ref 12.0–15.0)
Immature Granulocytes: 0 %
Lymphocytes Relative: 55 %
Lymphs Abs: 3.4 10*3/uL (ref 0.7–4.0)
MCH: 30.6 pg (ref 26.0–34.0)
MCHC: 34.5 g/dL (ref 30.0–36.0)
MCV: 88.7 fL (ref 80.0–100.0)
Monocytes Absolute: 0.4 10*3/uL (ref 0.1–1.0)
Monocytes Relative: 6 %
Neutro Abs: 2.2 10*3/uL (ref 1.7–7.7)
Neutrophils Relative %: 36 %
Platelets: 317 10*3/uL (ref 150–400)
RBC: 3.99 MIL/uL (ref 3.87–5.11)
RDW: 12.1 % (ref 11.5–15.5)
WBC: 6.2 10*3/uL (ref 4.0–10.5)
nRBC: 0 % (ref 0.0–0.2)

## 2021-08-25 LAB — COMPREHENSIVE METABOLIC PANEL
ALT: 24 U/L (ref 0–44)
AST: 22 U/L (ref 15–41)
Albumin: 3.9 g/dL (ref 3.5–5.0)
Alkaline Phosphatase: 49 U/L (ref 38–126)
Anion gap: 7 (ref 5–15)
BUN: 10 mg/dL (ref 6–20)
CO2: 27 mmol/L (ref 22–32)
Calcium: 8.9 mg/dL (ref 8.9–10.3)
Chloride: 105 mmol/L (ref 98–111)
Creatinine, Ser: 0.89 mg/dL (ref 0.44–1.00)
GFR, Estimated: >60 mL/min (ref >60)
Glucose, Bld: 114 mg/dL — ABNORMAL HIGH (ref 70–99)
Potassium: 4.1 mmol/L (ref 3.5–5.1)
Sodium: 139 mmol/L (ref 135–145)
Total Bilirubin: 0.5 mg/dL (ref 0.3–1.2)
Total Protein: 7.1 g/dL (ref 6.5–8.1)

## 2021-08-25 MED ORDER — TAMOXIFEN CITRATE 20 MG PO TABS: 20.0000 mg | ORAL_TABLET | Freq: Every day | ORAL | 4 refills | Status: DC

## 2021-11-09 NOTE — Telephone Encounter (Signed)
Called patient appointment made with patient. Aware Dr. Lorelei Pont is at [facility].

## 2021-11-11 ENCOUNTER — Other Ambulatory Visit: Payer: Self-pay

## 2021-11-11 ENCOUNTER — Ambulatory Visit (INDEPENDENT_AMBULATORY_CARE_PROVIDER_SITE_OTHER): Payer: Commercial Managed Care - PPO | Admitting: Family Medicine

## 2021-11-11 ENCOUNTER — Ambulatory Visit (INDEPENDENT_AMBULATORY_CARE_PROVIDER_SITE_OTHER)
Admission: RE | Admit: 2021-11-11 | Discharge: 2021-11-11 | Disposition: A | Discharge status: Home / Self Care | Payer: Commercial Managed Care - PPO | Source: Ambulatory Visit | Attending: Family Medicine | Admitting: Family Medicine

## 2021-11-11 ENCOUNTER — Encounter: Payer: Self-pay | Admitting: Family Medicine

## 2021-11-11 VITALS — BP 100/70 | HR 76 | Temp 98.4°F | Ht 60.0 in | Wt 161.2 lb

## 2021-11-11 DIAGNOSIS — Z170 Estrogen receptor positive status [ER+]: Secondary | ICD-10-CM

## 2021-11-11 DIAGNOSIS — G8929 Other chronic pain: Secondary | ICD-10-CM

## 2021-11-11 DIAGNOSIS — C50811 Malignant neoplasm of overlapping sites of right female breast: Secondary | ICD-10-CM

## 2021-11-11 DIAGNOSIS — M549 Dorsalgia, unspecified: Secondary | ICD-10-CM | POA: Diagnosis not present

## 2021-11-11 MED ORDER — PREDNISONE 20 MG PO TABS: ORAL_TABLET | ORAL | 0 refills | Status: DC

## 2021-11-11 MED ORDER — TIZANIDINE HCL 4 MG PO TABS: 2.0000 mg | ORAL_TABLET | Freq: Every evening | ORAL | 2 refills | Status: DC | PRN

## 2021-11-11 NOTE — Progress Notes (Signed)
Monica Mitcheltree T. Marrio Scribner, MD, [place] at [facility] [place], [address]  Phone: [phone]   FAX: [phone]  Monica Black - 41 y.o. female   MRN [id number]   Date of Birth: 12-18-1980  Date: 11/11/2021   PCP: Pleas Koch, NP   Referral: Pleas Koch, NP  Chief Complaint  Patient presents with   Back Pain    Low Left Side of Back-Hx Breast Cancer 5 years ago    This visit occurred during the SARS-CoV-2 public health emergency.  Safety protocols were in place, including screening questions prior to the visit, additional usage of staff PPE, and extensive cleaning of exam room while observing appropriate contact time as indicated for disinfecting solutions.   Subjective:   Monica Black is a 41 y.o. very pleasant female patient with Body mass index is 31.49 kg/m. who presents with the following:  3 months + this is in the left lowest aspect of the spine.  Is been present for now approximately 3 to 4 months without improvement.  She has been doing some basic things such as NSAIDs, ice and heat.  She also has been doing some active stretching with yoga.  Predominantly her pain is in the L4-5, L5-S1 region. Occasionally will be quite severe and it will hit her quickly, but she denies any numbness, tingling, or new radicular symptoms.  No new weakness.  Left low back pain, history is significant for Stage 3 right sided breast cancer.   - Dr. Jeannetta Nap notes reviewed. 5 years from definitive surgery Tamoxifen - still ongoing.  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 She also had 6 out of 21 axillary lymph nodes that were positive. She had 100% positivity for estrogen receptor,, and HER2 negative. Ultimately she did have bilateral mastectomy Cancer is described as pT3, PN 2, stage IIIa ductal carcinoma.  Estrogen and progesterone receptor positive.  Occasionally, when it  is notably worse she is having some difficulty walking due to pain.  Having some difficulty walking Now has a burning in the feet.   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    Review of Systems is noted in the HPI, as appropriate   Patient Active Problem List   Diagnosis Date Noted   Pelvic pain 12/22/2020   History of breast cancer 10/01/2019   Preventative health care 03/01/2018    Past Medical History:  Diagnosis Date   Breast cancer [facility])     Past Surgical History:  Procedure Laterality Date   BREAST LUMPECTOMY Right    MASTECTOMY, RADICAL      Family History  Problem Relation Age of Onset   Other Mother        adopted, family history unk   Asthma Father    Brain cancer Paternal Grandfather    Parkinson's disease Paternal Uncle    Breast cancer Neg Hx    Ovarian cancer Neg Hx      Objective:   BP 100/70    Pulse 76    Temp 98.4 F (36.9 C) (Temporal)    Ht 5' (1.524 m)    Wt 161 lb 4 oz (73.1 kg)    SpO2 99%    BMI 31.49 kg/m    Range of motion at  the waist: Flexion: normal Extension: normal Lateral bending: normal Rotation: all normal  No echymosis or edema Rises to examination table with  no difficulty Gait: non antalgic  Inspection/Deformity: N Paraspinus Tenderness: Minimal L5-S1, and predominant pain with compression at the SI joint and adjacent near the confluence of the S1 and sacrum.  B Ankle Dorsiflexion (L5,4): 5/5 B Great Toe Dorsiflexion (L5,4): 5/5 Heel Walk (L5): WNL Toe Walk (S1): WNL Rise/Squat (L4): WNL  SENSORY B Medial Foot (L4): WNL B Dorsum (L5): WNL B Lateral (S1): WNL Light Touch: WNL  REFLEXES Knee (L4): 2+ Ankle (S1): 2+  B SLR, seated: neg B SLR, supine: neg B FABER: neg B Reverse FABER: neg B Greater Troch: NT B Log Roll: neg B Sciatic Notch: NT   Radiology: Watt Climes, MD - 03/22/2019  Formatting of this note might be different from the original.  [place] (WHOLE BODY), 03/22/2019 1:04 PM   INDICATION: CT CHEST ABDOMEN PELVIS W CONTRAST \ C50.911 Malignant neoplasm of right breast in female, estrogen receptor positive, unspecified site of breast ([place]) \ Z17.0 Malignant neoplasm of right breast in female, estrogen receptor positive, unspecified site of breast ([place])   COMPARISON: NM whole body bone scan 09/20/2017, CT chest abdomen and pelvis.   TECHNIQUE: After intravenous administration of 29.5 mCi of Tc-88mMDP, anterior and posterior images of the entire body in addition to skull spot views were performed.   LIMITATIONS: None.   FINDINGS:   Physiological distribution of the radiotracer.   Interval development of 2 contiguous focal area of uptake in the right anterior fourth and fifth ribs adjacent to the costochondral junction which correlates with fractures on the contemporaneous CT. Focal uptake in the posterior right rib is likely shine through.   Degenerative uptake in the bilateral acromioclavicular joints, shoulders, elbow joint, wrists, hip joints, knee joints, ankle joints and feet.   Central uptake in the face is likely sinusitis.   Diffuse uptake in the tibia, can reflect hypertrophic osteoarthropathy.   Background activity: Normal.   CONCLUSION:   1.  No scintigraphic evidence of osseous metastasis.  2.  Rib fractures in the right 4th and 5th ribs. Exam End: 03/22/19 13:04   Specimen Collected: 03/22/19 13:05 Last Resulted: 03/22/19 15:00  Received From: [facility] Result Received: 12/22/20 07:56      Assessment and Plan:     ICD-10-CM   1. Chronic back pain greater than 3 months duration  M54.9 MR Lumbar Spine W Wo Contrast   G89.29 DG Lumbar Spine Complete    2. Malignant neoplasm of overlapping sites of right breast in female, estrogen receptor positive ([facility]  C50.811 MR Lumbar Spine W Wo Contrast   Z17.0 DG Lumbar Spine Complete     Greater than 3 months history of  back pain, mostly at the L5-S1 and SI joints adjacent to this area.  Start with oral steroids, Zanaflex, continue with motion and basic strengthening, yoga.  We can always try some physical therapy.  In a patient with stage III breast cancer, greater than 3 months history of pain without improvement despite conservative measures thus far, obtain a MRI of the lumbar spine with and without contrast to evaluate for bony metastasis.  MRI findings will determine plan of care.  If positive, urgent oncological involvement will be initiated.  Plain x-rays pending.  Meds ordered this encounter  Medications   predniSONE (DELTASONE) 20 MG tablet    Sig: 2 tabs po daily for 5 days, then 1 tab po daily for 5 days    Dispense:  15 tablet  Refill:  0   tiZANidine (ZANAFLEX) 4 MG tablet    Sig: Take 0.5-1 tablets (2-4 mg total) by mouth at bedtime as needed for muscle spasms.    Dispense:  30 tablet    Refill:  2   There are no discontinued medications. Orders Placed This Encounter  Procedures   MR Lumbar Spine W Wo Contrast   DG Lumbar Spine Complete    Follow-up: No follow-ups on file.  [employer] One speech-to-text software was used for transcription in this dictation.  Possible transcriptional errors can occur using Editor, commissioning.   Signed,  Maud Deed. Lateka Rady, MD   Outpatient Encounter Medications as of 11/11/2021  Medication Sig   predniSONE (DELTASONE) 20 MG tablet 2 tabs po daily for 5 days, then 1 tab po daily for 5 days   tiZANidine (ZANAFLEX) 4 MG tablet Take 0.5-1 tablets (2-4 mg total) by mouth at bedtime as needed for muscle spasms.   tamoxifen (NOLVADEX) 20 MG tablet Take 1 tablet (20 mg total) by mouth daily.   No facility-administered encounter medications on file as of 11/11/2021.

## 2021-11-24 ENCOUNTER — Ambulatory Visit
Admission: RE | Admit: 2021-11-24 | Discharge: 2021-11-24 | Disposition: A | Discharge status: Home / Self Care | Payer: Commercial Managed Care - PPO | Source: Ambulatory Visit | Attending: Family Medicine | Admitting: Family Medicine

## 2021-11-24 DIAGNOSIS — Z170 Estrogen receptor positive status [ER+]: Secondary | ICD-10-CM

## 2021-11-24 DIAGNOSIS — C50811 Malignant neoplasm of overlapping sites of right female breast: Secondary | ICD-10-CM

## 2021-11-24 DIAGNOSIS — G8929 Other chronic pain: Secondary | ICD-10-CM

## 2021-11-24 MED ORDER — GADOBENATE DIMEGLUMINE 529 MG/ML IV SOLN
15.0000 mL | Freq: Once | INTRAVENOUS | Status: AC | PRN
  Administered 2021-11-24: 15 mL via INTRAVENOUS

## 2021-11-27 ENCOUNTER — Telehealth: Payer: Self-pay | Admitting: Family Medicine

## 2021-11-27 ENCOUNTER — Encounter: Payer: Self-pay | Admitting: *Deleted

## 2021-11-27 DIAGNOSIS — N858 Other specified noninflammatory disorders of uterus: Secondary | ICD-10-CM

## 2021-11-27 DIAGNOSIS — R937 Abnormal findings on diagnostic imaging of other parts of musculoskeletal system: Secondary | ICD-10-CM

## 2021-11-27 NOTE — Telephone Encounter (Signed)
ICD-10-CM   1. Uterine cyst  N85.8 US PELVIC COMPLETE WITH TRANSVAGINAL    2. Abnormal MRI, lumbar spine  R93.7 US PELVIC COMPLETE WITH TRANSVAGINAL

## 2021-12-01 ENCOUNTER — Ambulatory Visit
Admission: RE | Admit: 2021-12-01 | Discharge: 2021-12-01 | Disposition: A | Discharge status: Home / Self Care | Payer: Commercial Managed Care - PPO | Source: Ambulatory Visit | Attending: Family Medicine | Admitting: Family Medicine

## 2021-12-01 ENCOUNTER — Other Ambulatory Visit: Payer: Self-pay

## 2021-12-01 DIAGNOSIS — N858 Other specified noninflammatory disorders of uterus: Secondary | ICD-10-CM

## 2021-12-01 DIAGNOSIS — R937 Abnormal findings on diagnostic imaging of other parts of musculoskeletal system: Secondary | ICD-10-CM

## 2021-12-02 ENCOUNTER — Encounter: Payer: Self-pay | Admitting: Family Medicine

## 2021-12-03 ENCOUNTER — Telehealth: Payer: Self-pay | Admitting: *Deleted

## 2021-12-03 NOTE — Telephone Encounter (Signed)
Patient called and stated "I was having some back pain and had a MRI done. Due to the MRI results I had an [place]. They found I have a cyst and my endometrium was thicken. They recommended a follow up. I wanted to see Dr Berline Lopes." Explained that the message would be given to Dr Berline Lopes and the office will call her back. (Patient last seen in 09/2019)

## 2021-12-03 NOTE — Telephone Encounter (Signed)
Received call from Daritza this afternoon regarding a follow up appointment. Appointment scheduled for 12/28/21 at 3:15 pm (30 minutes). Patient is in agreement of appointment date and time.

## 2021-12-03 NOTE — Telephone Encounter (Signed)
Attempted to reach the patient to schedule an appt, LMOM to call the office back. Patient needs a 30 min appt

## 2021-12-28 ENCOUNTER — Encounter: Payer: Self-pay | Admitting: Gynecologic Oncology

## 2021-12-28 ENCOUNTER — Ambulatory Visit (HOSPITAL_BASED_OUTPATIENT_CLINIC_OR_DEPARTMENT_OTHER): Payer: Commercial Managed Care - PPO | Admitting: Gynecologic Oncology

## 2021-12-28 ENCOUNTER — Inpatient Hospital Stay: Payer: Commercial Managed Care - PPO | Attending: Gynecologic Oncology | Admitting: Gynecologic Oncology

## 2021-12-28 ENCOUNTER — Other Ambulatory Visit: Payer: Self-pay

## 2021-12-28 VITALS — BP 132/72 | HR 97 | Temp 99.2°F | Resp 16 | Ht 60.0 in | Wt 161.8 lb

## 2021-12-28 DIAGNOSIS — R9389 Abnormal findings on diagnostic imaging of other specified body structures: Secondary | ICD-10-CM

## 2021-12-28 DIAGNOSIS — N83209 Unspecified ovarian cyst, unspecified side: Secondary | ICD-10-CM

## 2021-12-28 DIAGNOSIS — Z853 Personal history of malignant neoplasm of breast: Secondary | ICD-10-CM

## 2021-12-28 DIAGNOSIS — Z9013 Acquired absence of bilateral breasts and nipples: Secondary | ICD-10-CM | POA: Diagnosis not present

## 2021-12-28 DIAGNOSIS — Z9221 Personal history of antineoplastic chemotherapy: Secondary | ICD-10-CM | POA: Diagnosis not present

## 2021-12-28 DIAGNOSIS — R1900 Intra-abdominal and pelvic swelling, mass and lump, unspecified site: Secondary | ICD-10-CM

## 2021-12-28 DIAGNOSIS — C50912 Malignant neoplasm of unspecified site of left female breast: Secondary | ICD-10-CM

## 2021-12-28 DIAGNOSIS — Z79810 Long term (current) use of selective estrogen receptor modulators (SERMs): Secondary | ICD-10-CM | POA: Diagnosis not present

## 2021-12-28 DIAGNOSIS — Z923 Personal history of irradiation: Secondary | ICD-10-CM | POA: Diagnosis not present

## 2021-12-28 NOTE — Progress Notes (Addendum)
Gynecologic Oncology Return Clinic Visit  12/28/21  Reason for Visit: Reestablish care, follow-up recent ultrasound findings of adnexal mass, thickened endometrium  Treatment History: The patient was initially diagnosed in 2007, after a biopsy was performed for microcalcifications in her right breast on a mammogram in June.  She underwent a right lumpectomy and sentinel lymph node biopsy in July 2017 which revealed invasive ductal carcinoma, ER/PR+, HER2 -. She was treated with chemotherapy (dose dense AC x4 cycles, followed by 4 cycles of a taxane  -reacted to Taxol with first treatment and subsequent 3 cycles were with Taxotere).  He completed this chemotherapy on 09/15/2016.  She then underwent bilateral mastectomies with left sentinel lymph node biopsy in December 2017.  Multifocal residual and base of ductal carcinoma was noted on final pathology from the right breast.  The left breast and left sentinel lymph node were negative for malignancy.  The patient was then started on tamoxifen and Xeloda (2 weeks on 1 week off for 8 cycles, completed 03/2017) in January 2018.  She underwent concurrent radiation which was completed in March 2018.  Genetic testing was also performed while she was in [place], which showed no pathogenic mutations.    The patient reports that overall she is doing well.  She was transitioned to Zoladex and letrozole in December 2018.  She notes that in 2016 her menses began to be very sporadic and light, often lasting only 1 day.  Her last menstrual bleeding was in July 2017 on her day of surgery.  She had hormone testing in 2016 or 17 and was told that this was normal and that she was not in menopause or premenopausal.   In terms of the Goserelin, she has some pain at the injection site.  Otherwise, since completing treatment in 2018, she notes some arthritis as well as continued weight gain (which had started before breast cancer and was one of the symptoms that ultimately led to  her diagnosis).  She has some vaginal dryness as well.  I saw the patient in 09/2019 for discussion of possible therapeutic BSO.  Interval History: She patient was transitioned to tamoxifen since her last visit with me, in March 2021.  This was done for symptoms related to her aromatase inhibitor.  She had [place] and estradiol checked about 3 months after coming off chemo suppression of her ovaries.  Estradiol was 3.7 (in menopausal range), FSH was 2.1 (premenopausal range).  Despite transition back to tamoxifen and coming off Zoladex, the patient has not had any menstrual bleeding.  Repeat estradiol and FSH testing in October 2021 showed similar results.  The patient was having some back pain and underwent MRI of her lumbar spine.  This showed a 5.4 cm cyst posterior to the uterus, likely adnexal in origin.  Follow up pelvic ultrasound on 12/01/2021 showed a 4.9 cm simple appearing cyst in the right adnexa.  Endometrial complex was noted to be heterogenous and 11 mm containing cystic foci and small amount of endometrial fluid.  Patient notes some constipation describes her bowel movements as harder than normal.    She reports having a grapefruit sized cyst in her 20s that ruptured and did not require surgical intervention.  Past Medical/Surgical History: Past Medical History:  Diagnosis Date   Breast cancer [facility])     Past Surgical History:  Procedure Laterality Date   BREAST LUMPECTOMY Right    MASTECTOMY, RADICAL      Family History  Problem Relation Age of Onset  Other Mother        adopted, family history unk   Asthma Father    Brain cancer Paternal Grandfather    Parkinson's disease Paternal Uncle    Breast cancer Neg Hx    Ovarian cancer Neg Hx     Social History   Socioeconomic History   Marital status: Married    Spouse name: Not on file   Number of children: Not on file   Years of education: Not on file   Highest education level: Not on file  Occupational History    Not on file  Tobacco Use   Smoking status: Never   Smokeless tobacco: Never  Substance and Sexual Activity   Alcohol use: No   Drug use: No   Sexual activity: Not on file  Other Topics Concern   Not on file  Social History Narrative   Married.   No children.   Will be working with [employer] through the [place].   Enjoys shopping, movies, yoga.     Social Determinants of Health   Financial Resource Strain: Not on file  Food Insecurity: Not on file  Transportation Needs: Not on file  Physical Activity: Not on file  Stress: Not on file  Social Connections: Not on file    Current Medications:  Current Outpatient Medications:    tamoxifen (NOLVADEX) 20 MG tablet, Take 1 tablet (20 mg total) by mouth daily., Disp: 90 tablet, Rfl: 4  Review of Systems: + Abdominal distention, appetite changes, leg swelling, dyspareunia, urinary frequency, hematuria, pelvic pain, vaginal discharge, muscle pain/cramping. Denies fevers, chills, fatigue, unexplained weight changes. Denies hearing loss, neck lumps or masses, mouth sores, ringing in ears or voice changes. Denies cough or wheezing.  Denies shortness of breath. Denies chest pain or palpitations. Denies pain, blood in stools, diarrhea, nausea, vomiting, or early satiety. Denies dysuria, hematuria or incontinence. Denies hot flashes, vaginal bleeding.   Denies joint pain. Denies itching, rash, or wounds. Denies dizziness, headaches, numbness or seizures. Denies swollen lymph nodes or glands, denies easy bruising or bleeding. Denies anxiety, depression, confusion, or decreased concentration.  Physical Exam: BP 132/72 (BP Location: Left Arm, Patient Position: Sitting)    Pulse 97    Temp 99.2 F (37.3 C) (Oral)    Resp 16    Ht 5' (1.524 m)    Wt 161 lb 12.8 oz (73.4 kg)    SpO2 100%    BMI 31.60 kg/m  General: Alert, oriented, no acute distress. HEENT: Normocephalic, atraumatic, sclera anicteric. Chest: Clear  to auscultation bilaterally.  No wheezes or rhonchi. Cardiovascular: Regular rate and rhythm, no murmurs. Abdomen: soft, nontender.  Normoactive bowel sounds.  No masses or hepatosplenomegaly appreciated.   Extremities: Grossly normal range of motion.  Warm, well perfused.  No edema bilaterally. Skin: No rashes or lesions noted. GU: Normal appearing external genitalia without erythema, excoriation, or lesions.  Speculum exam reveals well rugated vaginal mucosa.  Cervix normal in appearance with some atrophy of the face of the cervix.  No lesions noted.  Laboratory & Radiologic Studies: 12/01/21: Pelvic ultrasound exam IMPRESSION: Nonvisualization of ovaries.   4.9 cm diameter simple appearing cyst within RIGHT adnexa, may be of ovarian or paraovarian origin; recommend followup US in 3-6 months. Note: This recommendation does not apply to premenarchal patients or to those with increased risk (genetic, family history, elevated tumor markers or other high-risk factors) of ovarian cancer. Reference: Radiology 2019 Nov; 293(2):359-371.   Heterogeneous endometrial complex 11  mm thick containing cystic foci and small amount of endometrial fluid.   Endometrial thickness is considered abnormal for an asymptomatic post-menopausal female. Endometrial sampling recommended to exclude carcinoma.  10/27/21: MRI lumbar spine IMPRESSION: 1. Negative for metastatic disease lumbar spine 2. Mild lumbar degenerative change without stenosis 3. 5.4 cm cyst posterior to the uterus on the right. This is most likely adnexal in origin. Mild enhancement of the wall of the cyst. Review of the prior ultrasound 01/02/2021 identified 2.8 cm follicle right ovary. As this lesion is not adequately characterized, prompt [place] is recommended for further evaluation. Note: This recommendation does not apply to premenarchal patients and to those with increased risk (genetic, family history, elevated tumor markers or  other high-risk factors) of ovarian cancer. Reference: JACR 2020 Feb; 17(2):248-254  Assessment & Plan: Monica Black is a 41 y.o. 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  Discussed MRI and ultrasound findings with the patient.  On both MRI as well as ultrasound, cystic lesion of the adnexa appears simple.  This most likely represents a benign finding and is one that I do not think requires surgical intervention. I suspect that this may have arisen in the setting of Tamoxifen use.   I had previously seen the patient for discussion of therapeutic BSO and we can move forward with removal of her ovaries in the future if she is interested in this.  Currently she is on tamoxifen from a breast cancer standpoint given hormone testing that was still supporting premenopausal status. She is not currently on any ovarian suppression.   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  I suspect that this may be contributing to the thickened appearance of her endometrium.  Given need to sample her endometrial lining in the setting of tamoxifen use, I recommend that we proceed with outpatient surgery to perform hysteroscopy and endometrial sampling.  Procedure was discussed with the patient today and date for surgery selected.  36 minutes of total time was spent for this patient encounter, including preparation, face-to-face counseling with the patient and coordination of care, and documentation of the encounter.  Jeral Pinch, MD  Division of Gynecologic Oncology  Department of Obstetrics and Gynecology  [facility]

## 2021-12-28 NOTE — H&P (View-Only) (Signed)
Gynecologic Oncology Return Clinic Visit ? ?12/28/21 ? ?Reason for Visit: Reestablish care, follow-up recent ultrasound findings of adnexal mass, thickened endometrium ? ?Treatment History: ?The patient was initially diagnosed in 2007, after a biopsy was performed for microcalcifications in her right breast on a mammogram in June.  She underwent a right lumpectomy and sentinel lymph node biopsy in July 2017 which revealed invasive ductal carcinoma, ER/PR+, HER2 -. She was treated with chemotherapy (dose dense AC x4 cycles, followed by 4 cycles of a taxane  -reacted to Taxol with first treatment and subsequent 3 cycles were with Taxotere).  He completed this chemotherapy on 09/15/2016.  She then underwent bilateral mastectomies with left sentinel lymph node biopsy in December 2017.  Multifocal residual and base of ductal carcinoma was noted on final pathology from the right breast.  The left breast and left sentinel lymph node were negative for malignancy.  The patient was then started on tamoxifen and Xeloda (2 weeks on 1 week off for 8 cycles, completed 03/2017) in January 2018.  She underwent concurrent radiation which was completed in March 2018.  Genetic testing was also performed while she was in [place], which showed no pathogenic mutations.  ?  ?The patient reports that overall she is doing well.  She was transitioned to Zoladex and letrozole in December 2018.  She notes that in 2016 her menses began to be very sporadic and light, often lasting only 1 day.  Her last menstrual bleeding was in July 2017 on her day of surgery.  She had hormone testing in 2016 or 17 and was told that this was normal and that she was not in menopause or premenopausal. ?  ?In terms of the Goserelin, she has some pain at the injection site.  Otherwise, since completing treatment in 2018, she notes some arthritis as well as continued weight gain (which had started before breast cancer and was one of the symptoms that ultimately led to  her diagnosis).  She has some vaginal dryness as well. ? ?I saw the patient in 09/2019 for discussion of possible therapeutic BSO. ? ?Interval History: ?She patient was transitioned to tamoxifen since her last visit with me, in March 2021.  This was done for symptoms related to her aromatase inhibitor.  She had [place] and estradiol checked about 3 months after coming off chemo suppression of her ovaries.  Estradiol was 3.7 (in menopausal range), FSH was 2.1 (premenopausal range).  Despite transition back to tamoxifen and coming off Zoladex, the patient has not had any menstrual bleeding. ? ?Repeat estradiol and FSH testing in October 2021 showed similar results. ? ?The patient was having some back pain and underwent MRI of her lumbar spine.  This showed a 5.4 cm cyst posterior to the uterus, likely adnexal in origin.  Follow up pelvic ultrasound on 12/01/2021 showed a 4.9 cm simple appearing cyst in the right adnexa.  Endometrial complex was noted to be heterogenous and 11 mm containing cystic foci and small amount of endometrial fluid. ? ?Patient notes some constipation describes her bowel movements as harder than normal.   ? ?She reports having a grapefruit sized cyst in her 20s that ruptured and did not require surgical intervention. ? ?Past Medical/Surgical History: ?Past Medical History:  ?Diagnosis Date  ? Breast cancer ([place])   ? ? ?Past Surgical History:  ?Procedure Laterality Date  ? BREAST LUMPECTOMY Right   ? MASTECTOMY, RADICAL    ? ? ?Family History  ?Problem Relation Age of Onset  ?  Other Mother   ?     adopted, family history unk  ? Asthma Father   ? Brain cancer Paternal Grandfather   ? Parkinson's disease Paternal Uncle   ? Breast cancer Neg Hx   ? Ovarian cancer Neg Hx   ? ? ?Social History  ? ?Socioeconomic History  ? Marital status: Married  ?  Spouse name: Not on file  ? Number of children: Not on file  ? Years of education: Not on file  ? Highest education level: Not on file  ?Occupational History  ?  Not on file  ?Tobacco Use  ? Smoking status: Never  ? Smokeless tobacco: Never  ?Substance and Sexual Activity  ? Alcohol use: No  ? Drug use: No  ? Sexual activity: Not on file  ?Other Topics Concern  ? Not on file  ?Social History Narrative  ? Married.  ? No children.  ? Will be working with [employer] through the [employer].  ? Enjoys shopping, movies, yoga.    ? ?Social Determinants of Health  ? ?Financial Resource Strain: Not on file  ?Food Insecurity: Not on file  ?Transportation Needs: Not on file  ?Physical Activity: Not on file  ?Stress: Not on file  ?Social Connections: Not on file  ? ? ?Current Medications: ? ?Current Outpatient Medications:  ?  tamoxifen (NOLVADEX) 20 MG tablet, Take 1 tablet (20 mg total) by mouth daily., Disp: 90 tablet, Rfl: 4 ? ?Review of Systems: ?+ Abdominal distention, appetite changes, leg swelling, dyspareunia, urinary frequency, hematuria, pelvic pain, vaginal discharge, muscle pain/cramping. ?Denies fevers, chills, fatigue, unexplained weight changes. ?Denies hearing loss, neck lumps or masses, mouth sores, ringing in ears or voice changes. ?Denies cough or wheezing.  Denies shortness of breath. ?Denies chest pain or palpitations. ?Denies pain, blood in stools, diarrhea, nausea, vomiting, or early satiety. ?Denies dysuria, hematuria or incontinence. ?Denies hot flashes, vaginal bleeding.   ?Denies joint pain. ?Denies itching, rash, or wounds. ?Denies dizziness, headaches, numbness or seizures. ?Denies swollen lymph nodes or glands, denies easy bruising or bleeding. ?Denies anxiety, depression, confusion, or decreased concentration. ? ?Physical Exam: ?BP 132/72 (BP Location: Left Arm, Patient Position: Sitting)   Pulse 97   Temp 99.2 ?F (37.3 ?C) (Oral)   Resp 16   Ht 5' (1.524 m)   Wt 161 lb 12.8 oz (73.4 kg)   SpO2 100%   BMI 31.60 kg/m?  ?General: Alert, oriented, no acute distress. ?HEENT: Normocephalic, atraumatic, sclera anicteric. ?Chest: Clear  to auscultation bilaterally.  No wheezes or rhonchi. ?Cardiovascular: Regular rate and rhythm, no murmurs. ?Abdomen: soft, nontender.  Normoactive bowel sounds.  No masses or hepatosplenomegaly appreciated.   ?Extremities: Grossly normal range of motion.  Warm, well perfused.  No edema bilaterally. ?Skin: No rashes or lesions noted. ?GU: Normal appearing external genitalia without erythema, excoriation, or lesions.  Speculum exam reveals well rugated vaginal mucosa.  Cervix normal in appearance with some atrophy of the face of the cervix.  No lesions noted. ? ?Laboratory & Radiologic Studies: ?12/01/21: Pelvic ultrasound exam ?IMPRESSION: ?Nonvisualization of ovaries. ?  ?4.9 cm diameter simple appearing cyst within RIGHT adnexa, may be of ?ovarian or paraovarian origin; recommend followup US in 3-6 months. ?Note: This recommendation does not apply to premenarchal patients or ?to those with increased risk (genetic, family history, elevated ?tumor markers or other high-risk factors) of ovarian cancer. ?Reference: Radiology 2019 Nov; 293(2):359-371. ?  ?Heterogeneous endometrial complex 11 mm thick containing cystic foci ?and small  amount of endometrial fluid. ?  ?Endometrial thickness is considered abnormal for an asymptomatic ?post-menopausal female. Endometrial sampling recommended to exclude ?carcinoma. ? ?10/27/21: MRI lumbar spine ?IMPRESSION: ?1. Negative for metastatic disease lumbar spine ?2. Mild lumbar degenerative change without stenosis ?3. 5.4 cm cyst posterior to the uterus on the right. This is most ?likely adnexal in origin. Mild enhancement of the wall of the cyst. ?Review of the prior ultrasound 01/02/2021 identified 2.8 cm follicle ?right ovary. As this lesion is not adequately characterized, prompt ?[place] is recommended for further evaluation. Note: This recommendation ?does not apply to premenarchal patients and to those with increased ?risk (genetic, family history, elevated tumor markers or  other ?high-risk factors) of ovarian cancer. Reference: JACR 2020 Feb; ?17(2):248-254 ? ?Assessment & Plan: ?Lataya Varnell is a 41 y.o. woman with history of locally-advanced ER+ breast cancer currently on tamoxifen r

## 2021-12-28 NOTE — Patient Instructions (Signed)
Preparing for your Surgery ? ?Plan for surgery on January 26, 2022 with Dr. Jeral Pinch at [facility]. You will be scheduled for dilation and curettage of the uterus with hysteroscopy and myosure (sampling from the lining of the uterus with the assistance of a camera).  ? ?Pre-operative Testing ?-You will receive a phone call from presurgical testing at [facility] to discuss surgery instructions and arrange for lab work if needed. ? ?-Bring your insurance card, copy of an advanced directive if applicable, medication list. ? ?-You should not be taking blood thinners or aspirin at least ten days prior to surgery unless instructed by your surgeon. ? ?-Do not take supplements such as fish oil (omega 3), red yeast rice, turmeric before your surgery. You want to avoid medications with aspirin in them including headache powders such as BC or Goody's), Excedrin migraine. ? ?Day Before Surgery at Home ?-You will be advised you can have clear liquids up until 3 hours before your surgery.   ? ?Your role in recovery ?Your role is to become active as soon as directed by your doctor, while still giving yourself time to heal.  Rest when you feel tired. You will be asked to do the following in order to speed your recovery: ? ?- Cough and breathe deeply. This helps to clear and expand your lungs and can prevent pneumonia after surgery.  ?- STAY ACTIVE WHEN YOU GET HOME. Do mild physical activity. Walking or moving your legs help your circulation and body functions return to normal. Do not try to get up or walk alone the first time after surgery.   ?-If you develop swelling on one leg or the other, pain in the back of your leg, redness/warmth in one of your legs, please call the office or go to the Emergency Room to have a doppler to rule out a blood clot. For shortness of breath, chest pain-seek care in the Emergency Room as soon as possible. ?- Actively manage your pain. Managing your  pain lets you move in comfort. We will ask you to rate your pain on a scale of zero to 10. It is your responsibility to tell your doctor or nurse where and how much you hurt so your pain can be treated. ? ?Special Considerations ?-Your final pathology results from surgery should be available around one week after surgery and the results will be relayed to you when available. ? ?-FMLA forms can be faxed to [phone] and please allow 5-7 business days for completion. ? ?Pain Management After Surgery ?-Make sure that you have Tylenol and Ibuprofen at home to use on a regular basis after surgery for pain control. We recommend alternating the medications every hour to six hours as needed since they work differently and are processed in the body differently for pain relief. ? ?-Review the attached handout on narcotic use and their risks and side effects.  ? ?Bowel Regimen ?-It is important to prevent constipation and drink adequate amounts of liquids.  ? ?Risks of Surgery ?Risks of surgery are low but include bleeding, infection, damage to surrounding structures, re-operation, blood clots, and very rarely death. ? ?AFTER SURGERY INSTRUCTIONS ? ?Return to work:  1-2 days if applicable ? ?Activity: ?1. Be up and out of the bed during the day.  Take a nap if needed.  You may walk up steps but be careful and use the hand rail.  Stair climbing will tire you more than you think, you may  need to stop part way and rest.  ? ?2. No lifting or straining for 1 week over 10 pounds. No pushing, pulling, straining for 1 week. ? ?3. No driving for minimum 24 hours after surgery.  Do not drive if you are taking narcotic pain medicine and make sure that your reaction time has returned.  ? ?4. You can shower as soon as the next day after surgery. Shower daily. No tub baths or submerging your body in water until cleared by your surgeon for minimum of 2 weeks. ? ?5. No sexual activity and nothing in the vagina for 2 weeks minimum. ? ?6.  You may experience vaginal spotting and discharge after surgery.  The spotting is normal but if you experience heavy bleeding, call our office. ? ?7. Take Tylenol or ibuprofen for pain. Monitor your Tylenol intake to a max of 4,000 mg in a 24 hour period. You can alternate these medications after surgery. ? ?Diet: ?1. Low sodium Heart Healthy Diet is recommended but you are cleared to resume your normal (before surgery) diet after your procedure. ? ?2. It is safe to use a laxative, such as Miralax or Colace, if you have difficulty moving your bowels.  ? ?Wound Care: ?1. Keep clean and dry.  Shower daily. ? ?Reasons to call the Doctor: ?Fever - Oral temperature greater than 100.4 degrees Fahrenheit ?Foul-smelling vaginal discharge ?Difficulty urinating ?Nausea and vomiting ?Difficulty breathing with or without chest pain ?New calf pain especially if only on one side ?Sudden, continuing increased vaginal bleeding with or without clots. ?  ?Contacts: ?For questions or concerns you should contact: ? ?Dr. Jeral Pinch at [phone] ? ?Joylene John, NP at [phone] ? ?After Hours: call [phone] and have the GYN Oncologist paged/contacted (after 5 pm or on the weekends). ? ?Messages sent via mychart are for non-urgent matters and are not responded to after hours so for urgent needs, please call the after hours number. ? ? ? ?  ?

## 2021-12-29 ENCOUNTER — Telehealth: Payer: Self-pay

## 2021-12-29 NOTE — Patient Instructions (Signed)
Preparing for your Surgery ?  ?Plan for surgery on January 26, 2022 with Dr. Jeral Pinch at [facility]. You will be scheduled for dilation and curettage of the uterus with hysteroscopy and myosure (sampling from the lining of the uterus with the assistance of a camera).  ?  ?Pre-operative Testing ?-You will receive a phone call from presurgical testing at [facility] to discuss surgery instructions and arrange for lab work if needed. ?  ?-Bring your insurance card, copy of an advanced directive if applicable, medication list. ?  ?-You should not be taking blood thinners or aspirin at least ten days prior to surgery unless instructed by your surgeon. ?  ?-Do not take supplements such as fish oil (omega 3), red yeast rice, turmeric before your surgery. You want to avoid medications with aspirin in them including headache powders such as BC or Goody's), Excedrin migraine. ?  ?Day Before Surgery at Home ?-You will be advised you can have clear liquids up until 3 hours before your surgery.   ?  ?Your role in recovery ?Your role is to become active as soon as directed by your doctor, while still giving yourself time to heal.  Rest when you feel tired. You will be asked to do the following in order to speed your recovery: ?  ?- Cough and breathe deeply. This helps to clear and expand your lungs and can prevent pneumonia after surgery.  ?- STAY ACTIVE WHEN YOU GET HOME. Do mild physical activity. Walking or moving your legs help your circulation and body functions return to normal. Do not try to get up or walk alone the first time after surgery.   ?-If you develop swelling on one leg or the other, pain in the back of your leg, redness/warmth in one of your legs, please call the office or go to the Emergency Room to have a doppler to rule out a blood clot. For shortness of breath, chest pain-seek care in the Emergency Room as soon as possible. ?- Actively manage your pain. Managing  your pain lets you move in comfort. We will ask you to rate your pain on a scale of zero to 10. It is your responsibility to tell your doctor or nurse where and how much you hurt so your pain can be treated. ?  ?Special Considerations ?-Your final pathology results from surgery should be available around one week after surgery and the results will be relayed to you when available. ?  ?-FMLA forms can be faxed to [phone] and please allow 5-7 business days for completion. ?  ?Pain Management After Surgery ?-Make sure that you have Tylenol and Ibuprofen at home to use on a regular basis after surgery for pain control. We recommend alternating the medications every hour to six hours as needed since they work differently and are processed in the body differently for pain relief. ?  ?-Review the attached handout on narcotic use and their risks and side effects.  ?  ?Bowel Regimen ?-It is important to prevent constipation and drink adequate amounts of liquids.  ?  ?Risks of Surgery ?Risks of surgery are low but include bleeding, infection, damage to surrounding structures, re-operation, blood clots, and very rarely death. ?  ?AFTER SURGERY INSTRUCTIONS ?  ?Return to work:  1-2 days if applicable ?  ?Activity: ?1. Be up and out of the bed during the day.  Take a nap if needed.  You may walk up steps but be careful  and use the hand rail.  Stair climbing will tire you more than you think, you may need to stop part way and rest.  ?  ?2. No lifting or straining for 1 week over 10 pounds. No pushing, pulling, straining for 1 week. ?  ?3. No driving for minimum 24 hours after surgery.  Do not drive if you are taking narcotic pain medicine and make sure that your reaction time has returned.  ?  ?4. You can shower as soon as the next day after surgery. Shower daily. No tub baths or submerging your body in water until cleared by your surgeon for minimum of 2 weeks. ?  ?5. No sexual activity and nothing in the vagina for 2 weeks  minimum. ?  ?6. You may experience vaginal spotting and discharge after surgery.  The spotting is normal but if you experience heavy bleeding, call our office. ?  ?7. Take Tylenol or ibuprofen for pain. Monitor your Tylenol intake to a max of 4,000 mg in a 24 hour period. You can alternate these medications after surgery. ?  ?Diet: ?1. Low sodium Heart Healthy Diet is recommended but you are cleared to resume your normal (before surgery) diet after your procedure. ?  ?2. It is safe to use a laxative, such as Miralax or Colace, if you have difficulty moving your bowels.  ?  ?Wound Care: ?1. Keep clean and dry.  Shower daily. ?  ?Reasons to call the Doctor: ?Fever - Oral temperature greater than 100.4 degrees Fahrenheit ?Foul-smelling vaginal discharge ?Difficulty urinating ?Nausea and vomiting ?Difficulty breathing with or without chest pain ?New calf pain especially if only on one side ?Sudden, continuing increased vaginal bleeding with or without clots. ?  ?Contacts: ?For questions or concerns you should contact: ?  ?Dr. Jeral Pinch at [phone] ?  ?Joylene John, NP at [phone] ?  ?After Hours: call [phone] and have the GYN Oncologist paged/contacted (after 5 pm or on the weekends). ?  ?Messages sent via mychart are for non-urgent matters and are not responded to after hours so for urgent needs, please call the after hours number. ?

## 2021-12-29 NOTE — Progress Notes (Signed)
Patient here for follow up with Dr. Jeral Pinch and for a pre-operative discussion prior to her scheduled surgery on January 26, 2022. She is scheduled for dilation and curettage of the uterus with hysteroscopy and myosure. The surgery was discussed in detail.  See after visit summary for additional details. Visual aids used to discuss items related to surgery including sequential compression stockings, multi-modal pain regimen including tylenol, female reproductive system to discuss surgery in detail.    ?  ?Discussed post-op pain management in detail including the aspects of the enhanced recovery pathway. We discussed the use of tylenol post-op and to monitor for a maximum of 4,000 mg in a 24 hour period.  ?  ?Discussed the use of SCDs and measures to take at home to prevent DVT including frequent mobility.  Reportable signs and symptoms of DVT discussed. Post-operative instructions discussed and expectations for after surgery.  ?   ?5 minutes spent with the patient.  Verbalizing understanding of material discussed. No needs or concerns voiced at the end of the visit. Advised patient to call for any needs.   ? ?This appointment is included in the global surgical bundle as pre-operative teaching and has no charge.     ?

## 2021-12-29 NOTE — Telephone Encounter (Signed)
Called patient to review tamoxifen use before and after surgery (D & C and hysteroscopy 01/26/22) I spoke with Mateo Flow, RN and she suggested to stop the tamoxifen presently due to thickening of the endometrium, which is a potential side effect.  Dr. Chryl Heck will discuss resuming medications at appt in May. Patient verbalized understanding and told her someone from [facility]'s office will be contacting her with an appt date and time.  ?

## 2021-12-30 ENCOUNTER — Telehealth: Payer: Self-pay | Admitting: Hematology and Oncology

## 2021-12-30 NOTE — Telephone Encounter (Signed)
Scheduled appointment per 03/07 inbasket message. Left message.  ?

## 2022-01-19 ENCOUNTER — Other Ambulatory Visit: Payer: Self-pay

## 2022-01-19 ENCOUNTER — Encounter (HOSPITAL_BASED_OUTPATIENT_CLINIC_OR_DEPARTMENT_OTHER): Payer: Self-pay | Admitting: Gynecologic Oncology

## 2022-01-19 NOTE — Progress Notes (Signed)
Spoke w/ via phone for pre-op interview--- pt ?Lab needs dos----  urine preg             ?Lab results------ no ?COVID test -----patient states asymptomatic no test needed ?Arrive at ------- 0830 on 01-26-2022 ?NPO after MN NO Solid Food.  Clear liquids from MN until--- 0730 ?Med rec completed ?Medications to take morning of surgery ----- none ?Diabetic medication ----- n/a ?Patient instructed no nail polish to be worn day of surgery ?Patient instructed to bring photo id and insurance card day of surgery ?Patient aware to have Driver (ride ) / caregiver for 24 hours after surgery -- husband, brian ?Patient Special Instructions ----- n/a ?Pre-Op special Istructions ----- n/a ?Patient verbalized understanding of instructions that were given at this phone interview. ?Patient denies shortness of breath, chest pain, fever, cough at this phone interview.  ?

## 2022-01-25 ENCOUNTER — Telehealth: Payer: Self-pay | Admitting: *Deleted

## 2022-01-25 NOTE — Telephone Encounter (Signed)
Telephone call to check on pre-operative status.  Patient compliant with pre-operative instructions.  Reinforced nothing to eat after midnight. Clear liquids until 0730. Patient to arrive at 0830.  No questions or concerns voiced.  Instructed to call for any needs.  ?

## 2022-01-26 ENCOUNTER — Ambulatory Visit (HOSPITAL_BASED_OUTPATIENT_CLINIC_OR_DEPARTMENT_OTHER): Payer: Commercial Managed Care - PPO | Admitting: Anesthesiology

## 2022-01-26 ENCOUNTER — Encounter (HOSPITAL_BASED_OUTPATIENT_CLINIC_OR_DEPARTMENT_OTHER): Payer: Self-pay | Admitting: Gynecologic Oncology

## 2022-01-26 ENCOUNTER — Ambulatory Visit (HOSPITAL_BASED_OUTPATIENT_CLINIC_OR_DEPARTMENT_OTHER)
Admission: RE | Admit: 2022-01-26 | Discharge: 2022-01-26 | Disposition: A | Discharge status: Home / Self Care | Payer: Commercial Managed Care - PPO | Attending: Gynecologic Oncology | Admitting: Gynecologic Oncology

## 2022-01-26 ENCOUNTER — Other Ambulatory Visit: Payer: Self-pay

## 2022-01-26 ENCOUNTER — Encounter (HOSPITAL_BASED_OUTPATIENT_CLINIC_OR_DEPARTMENT_OTHER)
Admission: RE | Disposition: A | Discharge status: Home / Self Care | Payer: Self-pay | Source: Home / Self Care | Attending: Gynecologic Oncology

## 2022-01-26 DIAGNOSIS — R9389 Abnormal findings on diagnostic imaging of other specified body structures: Secondary | ICD-10-CM

## 2022-01-26 DIAGNOSIS — Z9221 Personal history of antineoplastic chemotherapy: Secondary | ICD-10-CM | POA: Insufficient documentation

## 2022-01-26 DIAGNOSIS — K5900 Constipation, unspecified: Secondary | ICD-10-CM | POA: Insufficient documentation

## 2022-01-26 DIAGNOSIS — Z9013 Acquired absence of bilateral breasts and nipples: Secondary | ICD-10-CM | POA: Diagnosis not present

## 2022-01-26 DIAGNOSIS — Z79810 Long term (current) use of selective estrogen receptor modulators (SERMs): Secondary | ICD-10-CM | POA: Insufficient documentation

## 2022-01-26 DIAGNOSIS — Z170 Estrogen receptor positive status [ER+]: Secondary | ICD-10-CM | POA: Diagnosis not present

## 2022-01-26 DIAGNOSIS — C50919 Malignant neoplasm of unspecified site of unspecified female breast: Secondary | ICD-10-CM | POA: Diagnosis not present

## 2022-01-26 DIAGNOSIS — Z923 Personal history of irradiation: Secondary | ICD-10-CM | POA: Diagnosis not present

## 2022-01-26 HISTORY — DX: Other specified postprocedural states: Z98.890

## 2022-01-26 HISTORY — DX: Other constipation: K59.09

## 2022-01-26 HISTORY — DX: Personal history of irradiation: Z92.3

## 2022-01-26 HISTORY — PX: DILATATION & CURETTAGE/HYSTEROSCOPY WITH MYOSURE: SHX6511

## 2022-01-26 HISTORY — DX: Personal history of antineoplastic chemotherapy: Z92.21

## 2022-01-26 HISTORY — DX: Unspecified osteoarthritis, unspecified site: M19.90

## 2022-01-26 HISTORY — DX: Adverse effect of antineoplastic and immunosuppressive drugs, initial encounter: G62.0

## 2022-01-26 HISTORY — DX: Prediabetes: R73.03

## 2022-01-26 HISTORY — DX: Abnormal findings on diagnostic imaging of other specified body structures: R93.89

## 2022-01-26 HISTORY — DX: Other complications of anesthesia, initial encounter: T88.59XA

## 2022-01-26 LAB — POCT PREGNANCY, URINE: Preg Test, Ur: NEGATIVE

## 2022-01-26 SURGERY — DILATATION & CURETTAGE/HYSTEROSCOPY WITH MYOSURE
Anesthesia: General | Site: Uterus

## 2022-01-26 MED ORDER — SCOPOLAMINE 1 MG/3DAYS TD PT72
MEDICATED_PATCH | TRANSDERMAL | Status: AC
  Filled 2022-01-26: qty 1

## 2022-01-26 MED ORDER — FENTANYL CITRATE (PF) 100 MCG/2ML IJ SOLN
INTRAMUSCULAR | Status: AC
  Filled 2022-01-26: qty 2

## 2022-01-26 MED ORDER — FENTANYL CITRATE (PF) 100 MCG/2ML IJ SOLN: 25.0000 ug | INTRAMUSCULAR | Status: DC | PRN

## 2022-01-26 MED ORDER — CELECOXIB 200 MG PO CAPS
ORAL_CAPSULE | ORAL | Status: AC
  Filled 2022-01-26: qty 2

## 2022-01-26 MED ORDER — SCOPOLAMINE 1 MG/3DAYS TD PT72
1.0000 | MEDICATED_PATCH | TRANSDERMAL | Status: DC
  Administered 2022-01-26: 1.5 mg via TRANSDERMAL

## 2022-01-26 MED ORDER — DEXAMETHASONE SODIUM PHOSPHATE 10 MG/ML IJ SOLN
4.0000 mg | INTRAMUSCULAR | Status: AC
  Administered 2022-01-26: 10 mg via INTRAVENOUS

## 2022-01-26 MED ORDER — SODIUM CHLORIDE 0.9 % IR SOLN
Status: DC | PRN
  Administered 2022-01-26: 3000 mL

## 2022-01-26 MED ORDER — BUPIVACAINE HCL 0.25 % IJ SOLN
INTRAMUSCULAR | Status: DC | PRN
  Administered 2022-01-26: 10 mL

## 2022-01-26 MED ORDER — LIDOCAINE HCL (PF) 2 % IJ SOLN
INTRAMUSCULAR | Status: AC
  Filled 2022-01-26: qty 5

## 2022-01-26 MED ORDER — MIDAZOLAM HCL 2 MG/2ML IJ SOLN
INTRAMUSCULAR | Status: AC
  Filled 2022-01-26: qty 2

## 2022-01-26 MED ORDER — PROPOFOL 10 MG/ML IV BOLUS
INTRAVENOUS | Status: AC
  Filled 2022-01-26: qty 20

## 2022-01-26 MED ORDER — CELECOXIB 200 MG PO CAPS
ORAL_CAPSULE | ORAL | Status: AC
  Filled 2022-01-26: qty 1

## 2022-01-26 MED ORDER — FENTANYL CITRATE (PF) 100 MCG/2ML IJ SOLN
INTRAMUSCULAR | Status: DC | PRN
Start: 2022-01-26 — End: 2022-01-26
  Administered 2022-01-26: 50 ug via INTRAVENOUS

## 2022-01-26 MED ORDER — LACTATED RINGERS IV SOLN: INTRAVENOUS | Status: DC

## 2022-01-26 MED ORDER — LIDOCAINE 2% (20 MG/ML) 5 ML SYRINGE
INTRAMUSCULAR | Status: DC | PRN
Start: 2022-01-26 — End: 2022-01-26
  Administered 2022-01-26: 60 mg via INTRAVENOUS

## 2022-01-26 MED ORDER — CELECOXIB 200 MG PO CAPS
200.0000 mg | ORAL_CAPSULE | ORAL | Status: AC
  Administered 2022-01-26: 200 mg via ORAL

## 2022-01-26 MED ORDER — DEXAMETHASONE SODIUM PHOSPHATE 10 MG/ML IJ SOLN
INTRAMUSCULAR | Status: AC
  Filled 2022-01-26: qty 1

## 2022-01-26 MED ORDER — PROPOFOL 10 MG/ML IV BOLUS
INTRAVENOUS | Status: DC | PRN
  Administered 2022-01-26: 200 mg via INTRAVENOUS
  Administered 2022-01-26: 200 ug/kg/min via INTRAVENOUS

## 2022-01-26 MED ORDER — ONDANSETRON HCL 4 MG/2ML IJ SOLN
INTRAMUSCULAR | Status: DC | PRN
  Administered 2022-01-26: 4 mg via INTRAVENOUS

## 2022-01-26 MED ORDER — MIDAZOLAM HCL 2 MG/2ML IJ SOLN
INTRAMUSCULAR | Status: DC | PRN
  Administered 2022-01-26: 2 mg via INTRAVENOUS

## 2022-01-26 MED ORDER — AMISULPRIDE (ANTIEMETIC) 5 MG/2ML IV SOLN: 10.0000 mg | Freq: Once | INTRAVENOUS | Status: DC | PRN

## 2022-01-26 MED ORDER — ONDANSETRON HCL 4 MG/2ML IJ SOLN
INTRAMUSCULAR | Status: AC
  Filled 2022-01-26: qty 2

## 2022-01-26 MED ORDER — KETOROLAC TROMETHAMINE 30 MG/ML IJ SOLN
INTRAMUSCULAR | Status: AC
Start: 2022-01-26 — End: ?
  Filled 2022-01-26: qty 1

## 2022-01-26 SURGICAL SUPPLY — 18 items
CATH ROBINSON RED A/P 16FR (CATHETERS) IMPLANT
DEVICE MYOSURE LITE (MISCELLANEOUS) IMPLANT
DEVICE MYOSURE REACH (MISCELLANEOUS) ×1 IMPLANT
DILATOR CANAL MILEX (MISCELLANEOUS) IMPLANT
DRSG TELFA 3X8 NADH (GAUZE/BANDAGES/DRESSINGS) ×2 IMPLANT
GAUZE 4X4 16PLY ~~LOC~~+RFID DBL (SPONGE) ×4 IMPLANT
GLOVE SURG ENC MOIS LTX SZ6 (GLOVE) ×2 IMPLANT
GLOVE SURG ENC MOIS LTX SZ6.5 (GLOVE) IMPLANT
GOWN STRL REUS W/TWL LRG LVL3 (GOWN DISPOSABLE) ×2 IMPLANT
IV NS IRRIG 3000ML ARTHROMATIC (IV SOLUTION) ×2 IMPLANT
KIT TURNOVER CYSTO (KITS) ×2 IMPLANT
PACK VAGINAL WOMENS (CUSTOM PROCEDURE TRAY) ×2 IMPLANT
PAD DRESSING TELFA 3X8 NADH (GAUZE/BANDAGES/DRESSINGS) ×1 IMPLANT
PAD OB MATERNITY 4.3X12.25 (PERSONAL CARE ITEMS) ×2 IMPLANT
PAD PREP 24X48 CUFFED NSTRL (MISCELLANEOUS) ×2 IMPLANT
SEAL ROD LENS SCOPE MYOSURE (ABLATOR) ×2 IMPLANT
TOWEL OR 17X26 10 PK STRL BLUE (TOWEL DISPOSABLE) ×2 IMPLANT
WATER STERILE IRR 500ML POUR (IV SOLUTION) ×2 IMPLANT

## 2022-01-26 NOTE — Anesthesia Preprocedure Evaluation (Signed)
Anesthesia Evaluation  ?Patient identified by MRN, date of birth, ID band ?Patient awake ? ? ? ?Reviewed: ?Allergy & Precautions, NPO status , Patient's Chart, lab work & pertinent test results ? ?History of Anesthesia Complications ?(+) PONV and history of anesthetic complications ? ?Airway ?Mallampati: II ? ?TM Distance: >3 FB ?Neck ROM: Full ? ? ? Dental ? ?(+) Dental Advisory Given ?  ?Pulmonary ?neg pulmonary ROS,  ?  ?breath sounds clear to auscultation ? ? ? ? ? ? Cardiovascular ?negative cardio ROS ? ? ?Rhythm:Regular Rate:Normal ? ? ?  ?Neuro/Psych ? Neuromuscular disease   ? GI/Hepatic ?negative GI ROS, Neg liver ROS,   ?Endo/Other  ?negative endocrine ROS ? Renal/GU ?negative Renal ROS  ? ?  ?Musculoskeletal ? ?(+) Arthritis ,  ? Abdominal ?  ?Peds ? Hematology ?negative hematology ROS ?(+)   ?Anesthesia Other Findings ? ? Reproductive/Obstetrics ? ?  ? ? ? ? ? ? ? ? ? ? ? ? ? ?  ?  ? ? ? ? ? ? ? ? ?Lab Results  ?Component Value Date  ? WBC 6.2 08/25/2021  ? HGB 12.2 08/25/2021  ? HCT 35.4 (L) 08/25/2021  ? MCV 88.7 08/25/2021  ? PLT 317 08/25/2021  ? ?Lab Results  ?Component Value Date  ? CREATININE 0.89 08/25/2021  ? BUN 10 08/25/2021  ? NA 139 08/25/2021  ? K 4.1 08/25/2021  ? CL 105 08/25/2021  ? CO2 27 08/25/2021  ? ? ?Anesthesia Physical ?Anesthesia Plan ? ?ASA: 2 ? ?Anesthesia Plan: General  ? ?Post-op Pain Management: Celebrex PO (pre-op)* and Minimal or no pain anticipated  ? ?Induction: Intravenous ? ?PONV Risk Score and Plan: 4 or greater and Scopolamine patch - Pre-op, Midazolam, Propofol infusion, TIVA, Dexamethasone, Ondansetron and Treatment may vary due to age or medical condition ? ?Airway Management Planned: LMA ? ?Additional Equipment:  ? ?Intra-op Plan:  ? ?Post-operative Plan: Extubation in OR ? ?Informed Consent: I have reviewed the patients History and Physical, chart, labs and discussed the procedure including the risks, benefits and alternatives  for the proposed anesthesia with the patient or authorized representative who has indicated his/her understanding and acceptance.  ? ? ? ?Dental advisory given ? ?Plan Discussed with: CRNA ? ?Anesthesia Plan Comments:   ? ? ? ? ? ? ?Anesthesia Quick Evaluation ? ?

## 2022-01-26 NOTE — Discharge Instructions (Addendum)
01/26/2022 ? ?Return to work: 1-2 days ? ?Activity: ?1. Be up and out of the bed during the day.  Take a nap if needed.   ? ?2. No lifting or straining for 1-2 weeks. ? ?3. No driving for at least 24 hours after anesthesia.  Do Not drive if you are taking narcotic pain medicine. ? ?4. Shower daily.  Use soap and water on your incision and pat dry; don't rub.  ? ?5. No sexual activity and nothing in the vagina for 2-4 weeks. ? ?Medications:  ?- Take ibuprofen and tylenol first line for pain control. May have ibuprofen today after 2:30 ? ?Diet: ?1. Low sodium Heart Healthy Diet is recommended. ? ?2. It is safe to use a laxative if you have difficulty moving your bowels.  ? ?Wound Care: ?1. Keep clean and dry.  Shower daily. ? ?Reasons to call the Doctor: ? ?Fever - Oral temperature greater than 100.4 degrees Fahrenheit ?Foul-smelling vaginal discharge ?Difficulty urinating ?Nausea and vomiting ?Difficulty breathing with or without chest pain ?New calf pain especially if only on one side ?Sudden, continuing increased vaginal bleeding with or without clots. ?  ?Follow-up: ?1. You will have a phone visit with Dr. Berline Lopes on 4/11. ? ?Contacts: ?For questions or concerns you should contact: ? ?Dr. Jeral Pinch at [phone] ?After hours and on week-ends call [phone] and ask to speak to the physician on call for Gynecologic Oncology ? ?DISCHARGE INSTRUCTIONS: HYSTEROSCOPY / ENDOMETRIAL ABLATION ?The following instructions have been prepared to help you care for yourself upon your return home. ? ?May Remove Scop patch on or before ? ?May take Ibuprofen after 2:30pm  ? ?May take stool softner while taking narcotic pain medication to prevent constipation.  Drink plenty of water. ? ?Personal hygiene: ? Use sanitary pads for vaginal drainage, not tampons. ? Shower the day after your procedure. ? NO tub baths, pools or Jacuzzis for 2-3 weeks. ? Wipe front to back after using the bathroom. ? ?Activity and limitations: ? Do  NOT drive or operate any equipment for 24 hours. The effects of anesthesia are still present ?and drowsiness may result. ? Do NOT rest in bed all day. ? Walking is encouraged. ? Walk up and down stairs slowly. ? You may resume your normal activity in one to two days or as indicated by your physician. ?Sexual activity: NO intercourse for at least 2 weeks after the procedure, or as indicated by your ?Doctor. ? ?Diet: Eat a light meal as desired this evening. You may resume your usual diet tomorrow. ? ?Return to Work: You may resume your work activities in one to two days or as indicated by your ?Doctor. ? ?What to expect after your surgery: Expect to have vaginal bleeding/discharge for 2-3 days and ?spotting for up to 10 days. It is not unusual to have soreness for up to 1-2 weeks. You may have a ?slight burning sensation when you urinate for the first day. Mild cramps may continue for a couple of ?days. You may have a regular period in 2-6 weeks. ? ?Call your doctor for any of the following: ? Excessive vaginal bleeding or clotting, saturating and changing one pad every hour. ? Inability to urinate 6 hours after discharge from hospital. ? Pain not relieved by pain medication. ? Fever of 100.4? F or greater. ? Unusual vaginal discharge or odor. ? ?Return to office _________________Call for an appointment ___________________ ?Patient?s signature: ______________________ ?Nurse?s signature ________________________ ? ?[facility] [phone]  ?  Post Anesthesia Home Care Instructions ? ?Activity: ?Get plenty of rest for the remainder of the day. A responsible individual must stay with you for 24 hours following the procedure.  ?For the next 24 hours, DO NOT: ?-Drive a car ?-Paediatric nurse ?-Drink alcoholic beverages ?-Take any medication unless instructed by your physician ?-Make any legal decisions or sign important papers. ? ?Meals: ?Start with liquid foods such as gelatin or soup. Progress to regular  foods as tolerated. Avoid greasy, spicy, heavy foods. If nausea and/or vomiting occur, drink only clear liquids until the nausea and/or vomiting subsides. Call your physician if vomiting continues. ? ?Special Instructions/Symptoms: ?Your throat may feel dry or sore from the anesthesia or the breathing tube placed in your throat during surgery. If this causes discomfort, gargle with warm salt water. The discomfort should disappear within 24 hours. ? ?If you had a scopolamine patch placed behind your ear for the management of post- operative nausea and/or vomiting: ? ?1. The medication in the patch is effective for 72 hours, after which it should be removed.  Wrap patch in a tissue and discard in the trash. Wash hands thoroughly with soap and water. ?2. You may remove the patch earlier than 72 hours if you experience unpleasant side effects which may include dry mouth, dizziness or visual disturbances. ?3. Avoid touching the patch. Wash your hands with soap and water after contact with the patch. ?    ? ? ? ?

## 2022-01-26 NOTE — Interval H&P Note (Signed)
History and Physical Interval Note: ? ?01/26/2022 ?10:16 AM ? ?Monica Black  has presented today for surgery, with the diagnosis of THICKENED ENDOMETRIUM.  The various methods of treatment have been discussed with the patient and family. After consideration of risks, benefits and other options for treatment, the patient has consented to  Procedure(s): ?DILATATION & CURETTAGE/HYSTEROSCOPY WITH MYOSURE (N/A) as a surgical intervention.  The patient's history has been reviewed, patient examined, no change in status, stable for surgery.  I have reviewed the patient's chart and labs.  Questions were answered to the patient's satisfaction.   ? ? ?[name] ? ? ?

## 2022-01-26 NOTE — Anesthesia Postprocedure Evaluation (Signed)
Anesthesia Post Note ? ?Patient: Monica Black ? ?Procedure(s) Performed: DILATATION & CURETTAGE/HYSTEROSCOPY WITH MYOSURE (Uterus) ? ?  ? ?Patient location during evaluation: PACU ?Anesthesia Type: General ?Level of consciousness: awake and alert ?Pain management: pain level controlled ?Vital Signs Assessment: post-procedure vital signs reviewed and stable ?Respiratory status: spontaneous breathing, nonlabored ventilation, respiratory function stable and patient connected to nasal cannula oxygen ?Cardiovascular status: blood pressure returned to baseline and stable ?Postop Assessment: no apparent nausea or vomiting ?Anesthetic complications: no ? ? ?No notable events documented. ? ?Last Vitals:  ?Vitals:  ? 01/26/22 1200 01/26/22 1230  ?BP: 110/76 107/78  ?Pulse: (!) 58 83  ?Resp: 16 16  ?Temp:  36.7 ?C  ?SpO2: 99% 99%  ?  ?Last Pain:  ?Vitals:  ? 01/26/22 1230  ?TempSrc:   ?PainSc: 4   ? ? ?  ?  ?  ?  ?  ?  ? ?Suzette Battiest E ? ? ? ? ?

## 2022-01-26 NOTE — Transfer of Care (Signed)
Immediate Anesthesia Transfer of Care Note ? ?Patient: Monica Black ? ?Procedure(s) Performed: Procedure(s) (LRB): ?DILATATION & CURETTAGE/HYSTEROSCOPY WITH MYOSURE (N/A) ? ?Patient Location: PACU ? ?Anesthesia Type: General ? ?Level of Consciousness: awake, alert  and oriented ? ?Airway & Oxygen Therapy: Patient Spontanous Breathing on room air ? ?Post-op Assessment: Report given to PACU RN and Post -op Vital signs reviewed and stable ? ?Post vital signs: Reviewed and stable ? ?Complications: No apparent anesthesia complications ?Last Vitals:  ?Vitals Value Taken Time  ?BP 115/71 01/26/22 1131  ?Temp 36.6 ?C 01/26/22 1131  ?Pulse 69 01/26/22 1139  ?Resp 15 01/26/22 1139  ?SpO2 99 % 01/26/22 1139  ?Vitals shown include unvalidated device data. ? ?Last Pain:  ?Vitals:  ? 01/26/22 0849  ?TempSrc: Oral  ?PainSc: 5   ?   ? ?Patients Stated Pain Goal: 5 (01/26/22 0849) ? ?Complications: No notable events documented. ?

## 2022-01-26 NOTE — Anesthesia Procedure Notes (Signed)
Procedure Name: LMA Insertion ?Date/Time: 01/26/2022 10:55 AM ?Performed by: Mechele Claude, CRNA ?Pre-anesthesia Checklist: Patient identified, Emergency Drugs available, Suction available and Patient being monitored ?Patient Re-evaluated:Patient Re-evaluated prior to induction ?Oxygen Delivery Method: Circle system utilized ?Preoxygenation: Pre-oxygenation with 100% oxygen ?Induction Type: IV induction ?Ventilation: Mask ventilation without difficulty ?LMA: LMA inserted ?LMA Size: 4.0 ?Number of attempts: 1 ?Airway Equipment and Method: Bite block ?Placement Confirmation: positive ETCO2 ?Tube secured with: Tape ?Dental Injury: Teeth and Oropharynx as per pre-operative assessment  ? ? ? ? ?

## 2022-01-26 NOTE — Op Note (Signed)
OPERATIVE NOTE ? ?PATIENT: Monica Black ? ?ENCOUNTER DATE: 01/26/22 ? ?Preop Diagnosis: Thickened endometrium ? ?Postoperative Diagnosis: same as above ? ?Surgery: Hysteroscopy, D&C (dilation and curettage) ? ?Surgeons:  Valarie Cones ?Assistant: none ? ?Anesthesia: General  ? ?Estimated blood loss: 10 ml ? ?IVF:  see I&O flowsheet ? ?Urine output: n/a ? ?Fluid deficit: 10 cc  ? ?Complications: None apparent ? ?Pathology: endometrial curetteings ? ?Operative findings: Small mobile uterus. Uterus sounded to just over 6 cm. On hysteroscopy atrophic endocervix and endometrium, ridges of tissue consistent with atrophy and scarring. No fluffy tissue identified. No tubal ostia definitively seen. Uterus sounded again after procedure and sounded to depth of 6.5cm.  ? ?Procedure: The patient was identified in the preoperative holding area. Informed consent was signed on the chart. Patient was seen history was reviewed and exam was performed.  ? ?The patient was then taken to the operating room and placed in the supine position with SCD hose on. General anesthesia was then induced without difficulty. She was then placed in the dorsolithotomy position. The perineum was prepped with Betadine. The vagina was prepped with Betadine. The patient was then draped after the prep was dried.  ? ?Timeout was performed the patient, procedure, antibiotic, allergy, and length of procedure.  ? ?The speculum was placed in the vagina. The single tooth tenaculum was placed on the anterior lip of the cervix. The uterine sound was placed in the cervix and advanced to the fundus. The cervix was successively dilated using pratts dilators to 33F. ? ?The hysteroscope was advanced into the uterus with findings noted above. The Myosure Reach was used to sample what appeared to be the endometrial cavity. Tissue was collected in the specimen sock.  ? ?The tenaculum was removed and hemostasis was observed.  ? ?The vagina was irrigated. ? ?All instrument,  suture, laparotomy, Ray-Tec, and needle counts were correct x2. The patient tolerated the procedure well and was taken recovery room in stable condition.  ? ?[name], MD ? ?

## 2022-01-27 ENCOUNTER — Telehealth: Payer: Self-pay

## 2022-01-27 ENCOUNTER — Encounter (HOSPITAL_BASED_OUTPATIENT_CLINIC_OR_DEPARTMENT_OTHER): Payer: Self-pay | Admitting: Gynecologic Oncology

## 2022-01-27 LAB — SURGICAL PATHOLOGY

## 2022-01-27 NOTE — Telephone Encounter (Signed)
Spoke with Ms. Muntean this morning. She states she is eating and drinking well. She has some burning with urination. Advised to monitor this and to call if this doesn't improve or she develops pain, odor or fever. She has not had a BM yet but is passing gas. Encouraged her to drink plenty of water. She denies fever or chills. She reports light bleeding which is expected. Instructed to call if bleeding increases and is heavy like a period or she has increasing pain. She rates her pain 3-4/10. She has not taken any pain medicine. Advised to use tylenol or ibuprofen as needed.  ? ?Instructed to call office with any fever, chills, purulent drainage, uncontrolled pain or any other questions or concerns. Patient verbalizes understanding.  ? ?Pt aware of post op appointments as well as the office number [phone] and after hours number [phone] to call if she has any questions or concerns  ?

## 2022-01-27 NOTE — Telephone Encounter (Signed)
Attempted to reach patient to check in with her post-operatively. Unable to reach patient. Left message requesting return call.   

## 2022-01-28 ENCOUNTER — Telehealth: Payer: Self-pay | Admitting: Hematology and Oncology

## 2022-01-28 NOTE — Telephone Encounter (Signed)
Rescheduled appointment per providers. Patient aware.  ? ?

## 2022-02-02 ENCOUNTER — Encounter: Payer: Self-pay | Admitting: Gynecologic Oncology

## 2022-02-02 ENCOUNTER — Inpatient Hospital Stay: Payer: Commercial Managed Care - PPO | Attending: Gynecologic Oncology | Admitting: Gynecologic Oncology

## 2022-02-02 DIAGNOSIS — Z79810 Long term (current) use of selective estrogen receptor modulators (SERMs): Secondary | ICD-10-CM

## 2022-02-02 DIAGNOSIS — Z9889 Other specified postprocedural states: Secondary | ICD-10-CM | POA: Diagnosis not present

## 2022-02-02 DIAGNOSIS — R9389 Abnormal findings on diagnostic imaging of other specified body structures: Secondary | ICD-10-CM

## 2022-02-02 DIAGNOSIS — C50919 Malignant neoplasm of unspecified site of unspecified female breast: Secondary | ICD-10-CM | POA: Diagnosis not present

## 2022-02-02 DIAGNOSIS — Z853 Personal history of malignant neoplasm of breast: Secondary | ICD-10-CM

## 2022-02-02 DIAGNOSIS — N83209 Unspecified ovarian cyst, unspecified side: Secondary | ICD-10-CM

## 2022-02-02 DIAGNOSIS — Z7189 Other specified counseling: Secondary | ICD-10-CM

## 2022-02-02 NOTE — H&P (View-Only) (Signed)
Gynecologic Oncology Telehealth Consult Note: Gyn-Onc ? ?I connected with Monica Black on 02/02/22 at  4:40 PM EDT by telephone and verified that I am speaking with the correct person using two identifiers. ? ?I discussed the limitations, risks, security and privacy concerns of performing an evaluation and management service by telemedicine and the availability of in-person appointments. I also discussed with the patient that there may be a patient responsible charge related to this service. The patient expressed understanding and agreed to proceed. ? ?Other persons participating in the visit and their role in the encounter: none. ? ?Patient's location: home ?Provider's location: [facility] ? ?Reason for Visit: follow-up recent surgery, treatment discussion ? ?Treatment History: ?12/01/2021: Pelvic ultrasound performed in the setting of cystic mass noted on lumbar MRI.  This showed a 4.9 cm simple appearing mass within the right adnexa.  3-48-monthfollow-up recommended.  Endometrial complex noted to be 11 mm and heterogenous, containing cystic foci and small amount of endometrial fluid. ?01/26/22: Hysteroscopy with D&C.  Findings at the time of surgery included small mobile uterus.  Atrophic endocervix and endometrium with scarring noted.  No fluffy endometrial tissue appreciated.  Final pathology revealed only endocervical tissue and lower uterine segment tissue, no hyperplasia or malignancy. ? ?Interval History: ?Doing well.  Recovered from surgery within a couple of days.  Still having some pelvic and back pain as she was before surgery. ? ?Past Medical/Surgical History: ?Past Medical History:  ?Diagnosis Date  ? Arthritis   ? [place]   ? Chronic constipation   ? per pt intermittant  ? Complication of anesthesia   ? History of cancer chemotherapy   ? right breast  completed 09-15-2016  and again completed 06/ 2018 after mastectomies  ? History of external beam radiation therapy   ? right  breast  01/ 2018  to 03/ 2018  ? Malignant neoplasm of overlapping sites of right breast in [place], [place] receptor [place]  ? oncologist--- previously was dr mJana Hakim, new one is dr pMamie Nick iChryl Heck  05-18-2016  s/p right breast lumpectomy w/ node dissection's; local advanced positive / 6 positive nodes  IDC,  Stage III,  ER/PR+,  completed chemo 11/ 2017;;  10-08-2016  s/p bilateral mastectomies w/ node dissection ,  residual IDC, left breast no maligancy,  completed chemo 06/ 2018 and radiation 03/ 2018  ? PONV (postoperative nausea and vomiting)   ? Pre-diabetes   ? Thickened endometrium   ? ? ?Past Surgical History:  ?Procedure Laterality Date  ? BREAST LUMPECTOMY WITH AXILLARY LYMPH NODE DISSECTION Right 05/18/2016  ? DILATATION & CURETTAGE/HYSTEROSCOPY WITH MYOSURE N/A 01/26/2022  ? Procedure: DILATATION & CURETTAGE/HYSTEROSCOPY WITH MYOSURE;  Surgeon: TLafonda Mosses MD;  [facility]  Service: Gy[facility];  Laterality: N/A;  ? ELBOW HARDWARE REMOVAL Left 2013  ? MODIFIED RADICAL MASTECTOMY W/ AXILLARY LYMPH NODE DISSECTION Bilateral 10/08/2016  ? ORIF ELBOW FRACTURE Left 2012  ? ? ?Family History  ?Problem Relation Age of Onset  ? Other Mother   ?     adopted, family history unk  ? Asthma Father   ? Brain cancer Paternal Grandfather   ? Parkinson's disease Paternal Uncle   ? Breast cancer Neg Hx   ? Ovarian cancer Neg Hx   ? ? ?Social History  ? ?Socioeconomic History  ? Marital status: Married  ?  Spouse name: Not on file  ? Number of children: Not on file  ? Years of education: Not on  file  ? Highest education level: Not on file  ?Occupational History  ? Not on file  ?Tobacco Use  ? Smoking status: Never  ? Smokeless tobacco: Never  ?Vaping Use  ? Vaping Use: Never used  ?Substance and Sexual Activity  ? Alcohol use: No  ? Drug use: Never  ? Sexual activity: Not on file  ?Other Topics Concern  ? Not on file  ?Social History Narrative  ? Married.  ? No children.  ? Will  be working with [employer] through the [place].  ? Enjoys shopping, movies, yoga.    ? ?Social Determinants of Health  ? ?Financial Resource Strain: Not on file  ?Food Insecurity: Not on file  ?Transportation Needs: Not on file  ?Physical Activity: Not on file  ?Stress: Not on file  ?Social Connections: Not on file  ? ? ?Current Medications: ? ?Current Outpatient Medications:  ?  cetirizine (ZYRTEC) 10 MG tablet, Take 10 mg by mouth as needed for allergies., Disp: , Rfl:  ?  tamoxifen (NOLVADEX) 20 MG tablet, Take 1 tablet (20 mg total) by mouth daily., Disp: 90 tablet, Rfl: 4 ? ?Review of Symptoms: ?Positives as per HPI. ? ?Physical Exam: ?There were no vitals taken for this visit. ?Given limitations of phone visit. ? ?Laboratory & Radiologic Studies: ?A. ENDOMETRIUM, CURETTAGE:  ?Negative for endometrium and endometrial epithelium  ?Benign endocervix and lower uterine segment  ? ?Assessment & Plan: ?Monica Black is a 41 y.o. 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  Status post endometrial sampling with significant atrophy and scarring noted, only able to sample lower uterine segment. ? ?Patient doing well from a postoperative standpoint and meeting milestones. ? ?She has been off tamoxifen since prior to surgery given thickened endometrium.  It is difficult to say if findings on ultrasound were related solely to this scar tissue noted on hysteroscopy.  We discussed the small but increased risk related to regrowth, hyperplasia, and malignancy of the endometrium.  While I am reassured by the benign pathology from her recent surgery, I worry that we may not have sampled the thickened lining seen on her ultrasound imaging. ? ?We had previously discussed the option of therapeutic BSO given her ER positive breast cancer.  She had wanted to wait and was still contemplating her  future fertility.  Based on what I saw during her hysteroscopy, I think that it would be very challenging if at all possible for her to achieve pregnancy.   ? ?We will we can follow her endometrium with serial imaging, the definitive way to rule out endometrial pathology would to proceed with hysterectomy.  I would also recommend bilateral salpingo-oophorectomy as we had previously contemplated due to her breast cancer.  Patient had some questions about restarting tamoxifen or some other antiestrogen agent.  I recommended that she talk with her medical oncologist about this. ?  ?I offered that the patient could have some time to think about proceeding with surgery.  I will have my nurse practitioner call her in the next couple of days.  The patient sounded like she was ready to schedule definitive surgery. ? ?I discussed the assessment and treatment plan with the patient. The patient was provided with an opportunity to ask questions and all were answered. The patient agreed with the plan and demonstrated an understanding of the instructions.  ? ?The patient was  advised to call back or see an in-person evaluation if the symptoms worsen or if the condition fails to improve as anticipated.  ? ?18 minutes of total time was spent for this patient encounter, including preparation, phone counseling with the patient and coordination of care, and documentation of the encounter. ? ? ?Jeral Pinch, MD  ?Division of Gynecologic Oncology  ?Department of Obstetrics and Gynecology  ?[facility]  ? ?

## 2022-02-02 NOTE — Progress Notes (Signed)
Gynecologic Oncology Telehealth Consult Note: Gyn-Onc ? ?I connected with Monica Black on 02/02/22 at  4:40 PM EDT by telephone and verified that I am speaking with the correct person using two identifiers. ? ?I discussed the limitations, risks, security and privacy concerns of performing an evaluation and management service by telemedicine and the availability of in-person appointments. I also discussed with the patient that there may be a patient responsible charge related to this service. The patient expressed understanding and agreed to proceed. ? ?Other persons participating in the visit and their role in the encounter: none. ? ?Patient's location: home ?Provider's location: [facility] ? ?Reason for Visit: follow-up recent surgery, treatment discussion ? ?Treatment History: ?12/01/2021: Pelvic ultrasound performed in the setting of cystic mass noted on lumbar MRI.  This showed a 4.9 cm simple appearing mass within the right adnexa.  3-81-monthfollow-up recommended.  Endometrial complex noted to be 11 mm and heterogenous, containing cystic foci and small amount of endometrial fluid. ?01/26/22: Hysteroscopy with D&C.  Findings at the time of surgery included small mobile uterus.  Atrophic endocervix and endometrium with scarring noted.  No fluffy endometrial tissue appreciated.  Final pathology revealed only endocervical tissue and lower uterine segment tissue, no hyperplasia or malignancy. ? ?Interval History: ?Doing well.  Recovered from surgery within a couple of days.  Still having some pelvic and back pain as she was before surgery. ? ?Past Medical/Surgical History: ?Past Medical History:  ?Diagnosis Date  ? Arthritis   ? Chem[place]-induced [place]   ? Chronic constipation   ? per pt intermittant  ? Complication of anesthesia   ? History of cancer chemotherapy   ? right breast  completed 09-15-2016  and again completed 06/ 2018 after mastectomies  ? History of external beam radiation therapy   ? right  breast  01/ 2018  to 03/ 2018  ? Malignant neoplasm of overlapping sites of right breast in female, estrogen receptor positive ([facility] 2017  ? oncologist--- previously was dr mJana Hakim, new one is dr pMamie Nick iChryl Heck  05-18-2016  s/p right breast lumpectomy w/ node dissection's; local advanced positive / 6 positive nodes  IDC,  Stage III,  ER/PR+,  completed chemo 11/ 2017;;  10-08-2016  s/p bilateral mastectomies w/ node dissection ,  residual IDC, left breast no maligancy,  completed chemo 06/ 2018 and radiation 03/ 2018  ? PONV (postoperative nausea and vomiting)   ? Pre-diabetes   ? Thickened endometrium   ? ? ?Past Surgical History:  ?Procedure Laterality Date  ? BREAST LUMPECTOMY WITH AXILLARY LYMPH NODE DISSECTION Right 05/18/2016  ? DILATATION & CURETTAGE/HYSTEROSCOPY WITH MYOSURE N/A 01/26/2022  ? Procedure: DILATATION & CURETTAGE/HYSTEROSCOPY WITH MYOSURE;  Surgeon: TLafonda Mosses MD;  [facility]  Service: Gynecology;  Laterality: N/A;  ? ELBOW HARDWARE REMOVAL Left 2013  ? MODIFIED RADICAL MASTECTOMY W/ AXILLARY LYMPH NODE DISSECTION Bilateral 10/08/2016  ? ORIF ELBOW FRACTURE Left 2012  ? ? ?Family History  ?Problem Relation Age of Onset  ? Other Mother   ?     adopted, family history unk  ? Asthma Father   ? Brain cancer Paternal Grandfather   ? Parkinson's disease Paternal Uncle   ? Breast cancer Neg Hx   ? Ovarian cancer Neg Hx   ? ? ?Social History  ? ?Socioeconomic History  ? Marital status: Married  ?  Spouse name: Not on file  ? Number of children: Not on file  ? Years of education: Not on  file  ? Highest education level: Not on file  ?Occupational History  ? Not on file  ?Tobacco Use  ? Smoking status: Never  ? Smokeless tobacco: Never  ?Vaping Use  ? Vaping Use: Never used  ?Substance and Sexual Activity  ? Alcohol use: No  ? Drug use: Never  ? Sexual activity: Not on file  ?Other Topics Concern  ? Not on file  ?Social History Narrative  ? Married.  ? No children.  ? Will  be working with [employer] through the [employer].  ? Enjoys shopping, movies, yoga.    ? ?Social Determinants of Health  ? ?Financial Resource Strain: Not on file  ?Food Insecurity: Not on file  ?Transportation Needs: Not on file  ?Physical Activity: Not on file  ?Stress: Not on file  ?Social Connections: Not on file  ? ? ?Current Medications: ? ?Current Outpatient Medications:  ?  cetirizine (ZYRTEC) 10 MG tablet, Take 10 mg by mouth as needed for allergies., Disp: , Rfl:  ?  tamoxifen (NOLVADEX) 20 MG tablet, Take 1 tablet (20 mg total) by mouth daily., Disp: 90 tablet, Rfl: 4 ? ?Review of Symptoms: ?Positives as per HPI. ? ?Physical Exam: ?There were no vitals taken for this visit. ?Given limitations of phone visit. ? ?Laboratory & Radiologic Studies: ?A. ENDOMETRIUM, CURETTAGE:  ?Negative for endometrium and endometrial epithelium  ?Benign endocervix and lower uterine segment  ? ?Assessment & Plan: ?Monica Black is a 41 y.o. 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  Status post endometrial sampling with significant atrophy and scarring noted, only able to sample lower uterine segment. ? ?Patient doing well from a postoperative standpoint and meeting milestones. ? ?She has been off tamoxifen since prior to surgery given thickened endometrium.  It is difficult to say if findings on ultrasound were related solely to this scar tissue noted on hysteroscopy.  We discussed the small but increased risk related to regrowth, hyperplasia, and malignancy of the endometrium.  While I am reassured by the benign pathology from her recent surgery, I worry that we may not have sampled the thickened lining seen on her ultrasound imaging. ? ?We had previously discussed the option of therapeutic BSO given her ER positive breast cancer.  She had wanted to wait and was still contemplating her  future fertility.  Based on what I saw during her hysteroscopy, I think that it would be very challenging if at all possible for her to achieve pregnancy.   ? ?We will we can follow her endometrium with serial imaging, the definitive way to rule out endometrial pathology would to proceed with hysterectomy.  I would also recommend bilateral salpingo-oophorectomy as we had previously contemplated due to her breast cancer.  Patient had some questions about restarting tamoxifen or some other antiestrogen agent.  I recommended that she talk with her medical oncologist about this. ?  ?I offered that the patient could have some time to think about proceeding with surgery.  I will have my nurse practitioner call her in the next couple of days.  The patient sounded like she was ready to schedule definitive surgery. ? ?I discussed the assessment and treatment plan with the patient. The patient was provided with an opportunity to ask questions and all were answered. The patient agreed with the plan and demonstrated an understanding of the instructions.  ? ?The patient was  advised to call back or see an in-person evaluation if the symptoms worsen or if the condition fails to improve as anticipated.  ? ?18 minutes of total time was spent for this patient encounter, including preparation, phone counseling with the patient and coordination of care, and documentation of the encounter. ? ? ?Jeral Pinch, MD  ?Division of Gynecologic Oncology  ?Department of Obstetrics and Gynecology  ?[facility]  ? ?

## 2022-02-10 ENCOUNTER — Other Ambulatory Visit: Payer: Self-pay | Admitting: Family Medicine

## 2022-02-12 ENCOUNTER — Telehealth: Payer: Self-pay | Admitting: *Deleted

## 2022-02-12 NOTE — Telephone Encounter (Signed)
Spoke with Monica Black this morning and offered her surgical date of April 26th, 2023. Patient is not able to have surgery this day as her parents will be out of town and they will need to be there to help her. Patient reports she has thought about the surgery and would like to have her ovaries removed.  ?Advised patient that our office will be in contact will additional surgical dates. Patient verbalized understanding.  ?

## 2022-02-12 NOTE — Telephone Encounter (Signed)
Attempted to speak to pt to offer OR date of February 17, 2022 but unable to reach pt. LVM for return call.  ?

## 2022-02-16 ENCOUNTER — Telehealth: Payer: Self-pay | Admitting: *Deleted

## 2022-02-16 ENCOUNTER — Other Ambulatory Visit: Payer: Self-pay | Admitting: Gynecologic Oncology

## 2022-02-16 DIAGNOSIS — R9389 Abnormal findings on diagnostic imaging of other specified body structures: Secondary | ICD-10-CM

## 2022-02-16 DIAGNOSIS — Z853 Personal history of malignant neoplasm of breast: Secondary | ICD-10-CM

## 2022-02-16 DIAGNOSIS — N83209 Unspecified ovarian cyst, unspecified side: Secondary | ICD-10-CM

## 2022-02-16 NOTE — Telephone Encounter (Signed)
Attempted to speak with pt to offer surgery dates but unable to reach pt. LVM for return call.  ?

## 2022-02-16 NOTE — Telephone Encounter (Signed)
Spoke with pt this morning and offered her some dates for surgery. She stated she would like the May 9th surgery date. Providers informed.  ?

## 2022-02-22 NOTE — Patient Instructions (Addendum)
DUE TO COVID-19 ONLY TWO VISITORS  (aged 41 and older)  ARE ALLOWED TO COME WITH YOU AND STAY IN THE WAITING ROOM ONLY DURING PRE OP AND PROCEDURE.   ? ?**NO VISITORS ARE ALLOWED IN THE SHORT STAY AREA OR RECOVERY ROOM!!** ? ? ? Your procedure is scheduled on: 03-02-22 ? ? Report to [facility] Main Entrance ? ?  Report to admitting at     [place]  AM ? ? Call this number if you have problems the morning of surgery [phone] ? ? ?Eat a light diet the day before surgery.  Examples including soups, broths, toast, yogurt, mashed potatoes.  Things to avoid include carbonated beverages (fizzy beverages), raw fruits and raw vegetables, or beans.  ? ?If your bowels are filled with gas, your surgeon will have difficulty visualizing your pelvic organs which increases your surgical risks.  ? Do not eat food :After Midnight. ? ? After Midnight you may have the following liquids until __0730____ AM DAY OF SURGERY  then nothing by mouth ? ?Water ?Black Coffee (sugar ok, NO MILK/CREAM OR CREAMERS)  ?Tea (sugar ok, NO MILK/CREAM OR CREAMERS) regular and decaf                             ?Plain Jell-O (NO RED)                                           ?Fruit ices (not with fruit pulp, NO RED)                                     ?Popsicles (NO RED)                                                                  ?Juice: apple, WHITE grape, WHITE cranberry ?Sports drinks like Gatorade (NO RED) ?Clear broth(vegetable,chicken,beef) ? ?             ? ?  ?  ?       If you have questions, please contact your surgeon?s office. ? ? ?FOLLOW ANY ADDITIONAL PRE OP INSTRUCTIONS YOU RECEIVED FROM YOUR SURGEON'S OFFICE!!! ?  ?  ?Oral Hygiene is also important to reduce your risk of infection.                                    ?Remember - BRUSH YOUR TEETH THE MORNING OF SURGERY WITH YOUR REGULAR TOOTHPASTE ? ? Do NOT smoke after Midnight ? ? Take these medicines the morning of surgery with A SIP OF WATER: NONE ? ?DO NOT TAKE ANY ORAL  DIABETIC MEDICATIONS DAY OF YOUR SURGERY ? ?                  ?           You may not have any metal on your body including hair pins, jewelry, and body piercing ? ?  Do not wear make-up, lotions, powders, perfumes/cologne, or deodorant ? ?Do not wear nail polish including gel and S&S, artificial/acrylic nails, or any other type of covering on natural nails including finger and toenails. If you have artificial nails, gel coating, etc. that needs to be removed by a nail salon please have this removed prior to surgery or surgery may need to be canceled/ delayed if the surgeon/ anesthesia feels like they are unable to be safely monitored.  ? ?Do not shave  48 hours prior to surgery.  ? ?        ? ? Do not bring valuables to the hospital. [facility] NOT ?            RESPONSIBLE   FOR VALUABLES. ? ? Contacts, dentures or bridgework may not be worn into surgery. ? ?  ?  ? Patients discharged on the day of surgery will not be allowed to drive home.  Someone NEEDS to stay with you for the first 24 hours after anesthesia. ? ?  ?            Please read over the following fact sheets you were given: IF [place] [phone] ? ?   [place] - Preparing for Surgery ?Before surgery, you can play an important role.  Because skin is not sterile, your skin needs to be as free of germs as possible.  You can reduce the number of germs on your skin by washing with CHG (chlorahexidine gluconate) soap before surgery.  CHG is an antiseptic cleaner which kills germs and bonds with the skin to continue killing germs even after washing. ?Please DO NOT use if you have an allergy to CHG or antibacterial soaps.  If your skin becomes reddened/irritated stop using the CHG and inform your nurse when you arrive at Short Stay. ?Do not shave (including legs and underarms) for at least 48 hours prior to the first CHG shower.  You may shave your face/neck. ?Please follow these  instructions carefully: ? 1.  Shower with CHG Soap the night before surgery and the  morning of Surgery. ? 2.  If you choose to wash your hair, wash your hair first as usual with your  normal  shampoo. ? 3.  After you shampoo, rinse your hair and body thoroughly to remove the  shampoo.                           4.  Use CHG as you would any other liquid soap.  You can apply chg directly  to the skin and wash  ?                     Gently with a scrungie or clean washcloth. ? 5.  Apply the CHG Soap to your body ONLY FROM THE NECK DOWN.   Do not use on face/ open      ?                     Wound or open sores. Avoid contact with eyes, ears mouth and genitals (private parts).  ?                     Production manager,  Genitals (private parts) with your normal soap. ?            6.  Wash thoroughly, paying special attention  to the area where your surgery  will be performed. ? 7.  Thoroughly rinse your body with warm water from the neck down. ? 8.  DO NOT shower/wash with your normal soap after using and rinsing off  the CHG Soap. ?               9.  Pat yourself dry with a clean towel. ?           10.  Wear clean pajamas. ?           11.  Place clean sheets on your bed the night of your first shower and do not  sleep with pets. ?Day of Surgery : ?Do not apply any lotions/deodorants the morning of surgery.  Please wear clean clothes to the hospital/surgery center. ? ?FAILURE TO FOLLOW THESE INSTRUCTIONS MAY RESULT IN THE CANCELLATION OF YOUR SURGERY ?PATIENT SIGNATURE_________________________________ ? ?NURSE SIGNATURE__________________________________ ? ?________________________________________________________________________  ?

## 2022-02-22 NOTE — Progress Notes (Addendum)
PCP - Alma Friendly, NP ?Cardiologist - no ? ?PPM/ICD -  ?Device Orders -  ?Rep Notified -  ? ?Chest x-ray -  ?EKG -  ?Stress Test -  ?ECHO -  ?Cardiac Cath -  ? ?Sleep Study -  ?CPAP -  ? ?Fasting Blood Sugar -  ?Checks Blood Sugar _____ times a day ? ?Blood Thinner Instructions: ?Aspirin Instructions: ? ?ERAS Protcol - ?PRE-SURGERY ? ? ?COVID vaccine -no ? ?Activity-- Able to walk a flight of stairs without SOB ?Anesthesia review: Pre DM, Neuropathy ? ?Patient denies shortness of breath, fever, cough and chest pain at PAT appointment ? ? ?All instructions explained to the patient, with a verbal understanding of the material. Patient agrees to go over the instructions while at home for a better understanding. Patient also instructed to self quarantine after being tested for COVID-19. The opportunity to ask questions was provided. ?  ?

## 2022-02-24 ENCOUNTER — Encounter (HOSPITAL_COMMUNITY): Payer: Self-pay

## 2022-02-24 ENCOUNTER — Encounter (HOSPITAL_COMMUNITY)
Admission: RE | Admit: 2022-02-24 | Discharge: 2022-02-24 | Disposition: A | Discharge status: Home / Self Care | Payer: Commercial Managed Care - PPO | Source: Ambulatory Visit | Attending: Gynecologic Oncology | Admitting: Gynecologic Oncology

## 2022-02-24 ENCOUNTER — Other Ambulatory Visit: Payer: Self-pay

## 2022-02-24 DIAGNOSIS — Z853 Personal history of malignant neoplasm of breast: Secondary | ICD-10-CM | POA: Diagnosis not present

## 2022-02-24 DIAGNOSIS — R9389 Abnormal findings on diagnostic imaging of other specified body structures: Secondary | ICD-10-CM | POA: Diagnosis not present

## 2022-02-24 DIAGNOSIS — N83209 Unspecified ovarian cyst, unspecified side: Secondary | ICD-10-CM | POA: Insufficient documentation

## 2022-02-24 DIAGNOSIS — Z01812 Encounter for preprocedural laboratory examination: Secondary | ICD-10-CM | POA: Insufficient documentation

## 2022-02-24 DIAGNOSIS — R7303 Prediabetes: Secondary | ICD-10-CM | POA: Insufficient documentation

## 2022-02-24 HISTORY — DX: Headache, unspecified: R51.9

## 2022-02-24 HISTORY — DX: Unspecified asthma, uncomplicated: J45.909

## 2022-02-24 HISTORY — DX: Pneumonia, unspecified organism: J18.9

## 2022-02-24 HISTORY — DX: Anxiety disorder, unspecified: F41.9

## 2022-02-24 LAB — COMPREHENSIVE METABOLIC PANEL
ALT: 21 U/L (ref 0–44)
AST: 23 U/L (ref 15–41)
Albumin: 4.3 g/dL (ref 3.5–5.0)
Alkaline Phosphatase: 44 U/L (ref 38–126)
Anion gap: 6 (ref 5–15)
BUN: 15 mg/dL (ref 6–20)
CO2: 27 mmol/L (ref 22–32)
Calcium: 9.3 mg/dL (ref 8.9–10.3)
Chloride: 107 mmol/L (ref 98–111)
Creatinine, Ser: 0.72 mg/dL (ref 0.44–1.00)
GFR, Estimated: >60 mL/min (ref >60)
Glucose, Bld: 121 mg/dL — ABNORMAL HIGH (ref 70–99)
Potassium: 4.2 mmol/L (ref 3.5–5.1)
Sodium: 140 mmol/L (ref 135–145)
Total Bilirubin: 0.7 mg/dL (ref 0.3–1.2)
Total Protein: 7.6 g/dL (ref 6.5–8.1)

## 2022-02-24 LAB — GLUCOSE, CAPILLARY: Glucose-Capillary: 111 mg/dL — ABNORMAL HIGH (ref 70–99)

## 2022-02-24 LAB — CBC
HCT: 39.3 % (ref 36.0–46.0)
Hemoglobin: 13.3 g/dL (ref 12.0–15.0)
MCH: 30.6 pg (ref 26.0–34.0)
MCHC: 33.8 g/dL (ref 30.0–36.0)
MCV: 90.3 fL (ref 80.0–100.0)
Platelets: 338 10*3/uL (ref 150–400)
RBC: 4.35 MIL/uL (ref 3.87–5.11)
RDW: 11.9 % (ref 11.5–15.5)
WBC: 5.5 10*3/uL (ref 4.0–10.5)
nRBC: 0 % (ref 0.0–0.2)

## 2022-02-24 LAB — HEMOGLOBIN A1C
Hgb A1c MFr Bld: 5.3 % (ref 4.8–5.6)
Mean Plasma Glucose: 105.41 mg/dL

## 2022-03-01 ENCOUNTER — Encounter: Payer: Self-pay | Admitting: Gynecologic Oncology

## 2022-03-01 ENCOUNTER — Telehealth: Payer: Self-pay | Admitting: *Deleted

## 2022-03-01 ENCOUNTER — Other Ambulatory Visit: Payer: Self-pay | Admitting: Gynecologic Oncology

## 2022-03-01 DIAGNOSIS — N83209 Unspecified ovarian cyst, unspecified side: Secondary | ICD-10-CM

## 2022-03-01 DIAGNOSIS — R9389 Abnormal findings on diagnostic imaging of other specified body structures: Secondary | ICD-10-CM

## 2022-03-01 MED ORDER — IBUPROFEN 800 MG PO TABS: 800.0000 mg | ORAL_TABLET | Freq: Three times a day (TID) | ORAL | 0 refills | Status: DC | PRN

## 2022-03-01 MED ORDER — OXYCODONE HCL 5 MG PO TABS: 5.0000 mg | ORAL_TABLET | ORAL | 0 refills | Status: DC | PRN

## 2022-03-01 MED ORDER — SENNOSIDES-DOCUSATE SODIUM 8.6-50 MG PO TABS: 2.0000 | ORAL_TABLET | Freq: Every day | ORAL | 0 refills | Status: DC

## 2022-03-01 NOTE — Telephone Encounter (Signed)
Attempted to speak with pt this afternoon to review pre op education and review medications sent into pt's pharmacy. Unable to reach pt. LVM informing pt of medication prescription and mychart message as well as a request for call back and call back number.  ?

## 2022-03-01 NOTE — Telephone Encounter (Signed)
Telephone call to check on pre-operative status.  Patient compliant with pre-operative instructions.  Reinforced nothing to eat after midnight. Clear liquids until 0730. Patient to arrive at 0815.  No questions or concerns voiced.  Instructed to call for any needs.  

## 2022-03-01 NOTE — Telephone Encounter (Signed)
Spoke with pt this afternoon to perform pre op education with pt. Informed pt that she's having a Robotic assisted total laparoscopic hysterectomy, bilateral salpingo-oophorectomy with possible staging if a cancer is identified, possible laparotomy with Dr.Tucker on Mar 02, 2022. Informed pt to avoid gas producing foods today to reduce risk of complications. Pre op pain management medications of oxy and ibuprofen has been sent in to pt's pharmacy for her to pick up today to have it ready for tomorrow post op along with senna help with bowel regimen. She can also use OTC miralax to help with bowel prep as well. Pt should stay active when she gets home to aid in healing and bowel regimen. Pt is to do no lifting or straining for 6 weeks over 10lbs and no pushing, pulling, straining for 6 weeks. Pt stated that she has a 19 month old foster child. She will have help for the 1 week. Per Joylene John, NP pt is to not lift, strain, push, pulling anything over 10lbs for 6 weeks. The more help pt has with the baby the better. She can lift with her arms but refrain from using her abdomen muscle or straining. She verbalized understanding. Also informed and reinforced to pt NO SEXUAL ACTIVITY AND NOTHING IN THE VAGINA FOR 8 WEEKS. Educated pt that the only thing holding her bowels in place are the stitches and if one breaks she is at risk for her bowels falling out. Pt verbalized understanding. Notify the office of any fevers (100.4 or greater), foul smelling vaginal discharge, difficulty urinating, increased pain, difficulty breathing, new calf pain, sudden continuing increased vaginal bleeding with or without clots.. After hours number is [phone]. Pt verbalized understanding and did not have any questions.  ?

## 2022-03-02 ENCOUNTER — Encounter (HOSPITAL_COMMUNITY)
Admission: RE | Disposition: A | Discharge status: Home / Self Care | Payer: Self-pay | Source: Home / Self Care | Attending: Gynecologic Oncology

## 2022-03-02 ENCOUNTER — Other Ambulatory Visit: Payer: Self-pay

## 2022-03-02 ENCOUNTER — Encounter (HOSPITAL_COMMUNITY): Payer: Self-pay | Admitting: Gynecologic Oncology

## 2022-03-02 ENCOUNTER — Ambulatory Visit (HOSPITAL_COMMUNITY): Payer: Commercial Managed Care - PPO | Admitting: Registered Nurse

## 2022-03-02 ENCOUNTER — Ambulatory Visit (HOSPITAL_COMMUNITY)
Admission: RE | Admit: 2022-03-02 | Discharge: 2022-03-02 | Disposition: A | Discharge status: Home / Self Care | Payer: Commercial Managed Care - PPO | Attending: Gynecologic Oncology | Admitting: Gynecologic Oncology

## 2022-03-02 ENCOUNTER — Ambulatory Visit (HOSPITAL_BASED_OUTPATIENT_CLINIC_OR_DEPARTMENT_OTHER): Payer: Commercial Managed Care - PPO | Admitting: Registered Nurse

## 2022-03-02 DIAGNOSIS — C50919 Malignant neoplasm of unspecified site of unspecified female breast: Secondary | ICD-10-CM

## 2022-03-02 DIAGNOSIS — N838 Other noninflammatory disorders of ovary, fallopian tube and broad ligament: Secondary | ICD-10-CM | POA: Insufficient documentation

## 2022-03-02 DIAGNOSIS — R9389 Abnormal findings on diagnostic imaging of other specified body structures: Secondary | ICD-10-CM | POA: Diagnosis not present

## 2022-03-02 DIAGNOSIS — Z853 Personal history of malignant neoplasm of breast: Secondary | ICD-10-CM | POA: Diagnosis not present

## 2022-03-02 DIAGNOSIS — N83209 Unspecified ovarian cyst, unspecified side: Secondary | ICD-10-CM

## 2022-03-02 DIAGNOSIS — Z79810 Long term (current) use of selective estrogen receptor modulators (SERMs): Secondary | ICD-10-CM | POA: Insufficient documentation

## 2022-03-02 DIAGNOSIS — Z170 Estrogen receptor positive status [ER+]: Secondary | ICD-10-CM

## 2022-03-02 DIAGNOSIS — R7303 Prediabetes: Secondary | ICD-10-CM

## 2022-03-02 HISTORY — PX: ROBOTIC ASSISTED TOTAL HYSTERECTOMY WITH BILATERAL SALPINGO OOPHERECTOMY: SHX6086

## 2022-03-02 LAB — TYPE AND SCREEN
ABO/RH(D): A NEG
Antibody Screen: NEGATIVE

## 2022-03-02 LAB — GLUCOSE, CAPILLARY: Glucose-Capillary: 112 mg/dL — ABNORMAL HIGH (ref 70–99)

## 2022-03-02 LAB — PREGNANCY, URINE: Preg Test, Ur: NEGATIVE

## 2022-03-02 LAB — ABO/RH: ABO/RH(D): A NEG

## 2022-03-02 SURGERY — HYSTERECTOMY, TOTAL, ROBOT-ASSISTED, LAPAROSCOPIC, WITH BILATERAL SALPINGO-OOPHORECTOMY
Anesthesia: General | Site: Abdomen | Laterality: Bilateral

## 2022-03-02 MED ORDER — ACETAMINOPHEN 325 MG PO TABS: 325.0000 mg | ORAL_TABLET | ORAL | Status: DC | PRN

## 2022-03-02 MED ORDER — ROCURONIUM BROMIDE 10 MG/ML (PF) SYRINGE
PREFILLED_SYRINGE | INTRAVENOUS | Status: AC
  Filled 2022-03-02: qty 10

## 2022-03-02 MED ORDER — CELECOXIB 200 MG PO CAPS
200.0000 mg | ORAL_CAPSULE | ORAL | Status: AC
  Administered 2022-03-02: 200 mg via ORAL
  Filled 2022-03-02: qty 1

## 2022-03-02 MED ORDER — ONDANSETRON HCL 4 MG/2ML IJ SOLN
INTRAMUSCULAR | Status: DC | PRN
  Administered 2022-03-02: 4 mg via INTRAVENOUS

## 2022-03-02 MED ORDER — STERILE WATER FOR IRRIGATION IR SOLN
Status: DC | PRN
  Administered 2022-03-02: 1000 mL

## 2022-03-02 MED ORDER — HEPARIN SODIUM (PORCINE) 5000 UNIT/ML IJ SOLN
5000.0000 [IU] | INTRAMUSCULAR | Status: AC
  Administered 2022-03-02: 5000 [IU] via SUBCUTANEOUS
  Filled 2022-03-02: qty 1

## 2022-03-02 MED ORDER — OXYCODONE HCL 5 MG/5ML PO SOLN: 5.0000 mg | Freq: Once | ORAL | Status: DC | PRN

## 2022-03-02 MED ORDER — DEXAMETHASONE SODIUM PHOSPHATE 4 MG/ML IJ SOLN
4.0000 mg | INTRAMUSCULAR | Status: AC
  Administered 2022-03-02: 8 mg via INTRAVENOUS

## 2022-03-02 MED ORDER — FENTANYL CITRATE PF 50 MCG/ML IJ SOSY
25.0000 ug | PREFILLED_SYRINGE | INTRAMUSCULAR | Status: DC | PRN
  Administered 2022-03-02: 25 ug via INTRAVENOUS
  Administered 2022-03-02: 50 ug via INTRAVENOUS

## 2022-03-02 MED ORDER — PROPOFOL 10 MG/ML IV BOLUS
INTRAVENOUS | Status: DC | PRN
Start: 2022-03-02 — End: 2022-03-02
  Administered 2022-03-02: 130 mg via INTRAVENOUS

## 2022-03-02 MED ORDER — GABAPENTIN 300 MG PO CAPS
300.0000 mg | ORAL_CAPSULE | ORAL | Status: AC
  Administered 2022-03-02: 300 mg via ORAL
  Filled 2022-03-02: qty 1

## 2022-03-02 MED ORDER — ONDANSETRON HCL 4 MG/2ML IJ SOLN
INTRAMUSCULAR | Status: AC
  Filled 2022-03-02: qty 2

## 2022-03-02 MED ORDER — FENTANYL CITRATE (PF) 250 MCG/5ML IJ SOLN
INTRAMUSCULAR | Status: DC | PRN
  Administered 2022-03-02: 75 ug via INTRAVENOUS
  Administered 2022-03-02 (×2): 50 ug via INTRAVENOUS
  Administered 2022-03-02: 75 ug via INTRAVENOUS

## 2022-03-02 MED ORDER — LIDOCAINE 2% (20 MG/ML) 5 ML SYRINGE
INTRAMUSCULAR | Status: DC | PRN
  Administered 2022-03-02: 40 mg via INTRAVENOUS

## 2022-03-02 MED ORDER — VANCOMYCIN HCL IN DEXTROSE 1-5 GM/200ML-% IV SOLN
1000.0000 mg | Freq: Once | INTRAVENOUS | Status: AC
  Administered 2022-03-02: 1000 mg via INTRAVENOUS
  Filled 2022-03-02: qty 200

## 2022-03-02 MED ORDER — LACTATED RINGERS IR SOLN
Status: DC | PRN
  Administered 2022-03-02: 1000 mL

## 2022-03-02 MED ORDER — MIDAZOLAM HCL 5 MG/5ML IJ SOLN
INTRAMUSCULAR | Status: DC | PRN
  Administered 2022-03-02: 2 mg via INTRAVENOUS

## 2022-03-02 MED ORDER — ACETAMINOPHEN 10 MG/ML IV SOLN: 1000.0000 mg | Freq: Once | INTRAVENOUS | Status: DC | PRN

## 2022-03-02 MED ORDER — BUPIVACAINE HCL 0.25 % IJ SOLN
INTRAMUSCULAR | Status: DC | PRN
  Administered 2022-03-02: 26 mL

## 2022-03-02 MED ORDER — DEXAMETHASONE SODIUM PHOSPHATE 10 MG/ML IJ SOLN
INTRAMUSCULAR | Status: AC
  Filled 2022-03-02: qty 1

## 2022-03-02 MED ORDER — FENTANYL CITRATE (PF) 250 MCG/5ML IJ SOLN
INTRAMUSCULAR | Status: AC
  Filled 2022-03-02: qty 5

## 2022-03-02 MED ORDER — LIDOCAINE 20MG/ML (2%) 15 ML SYRINGE OPTIME
INTRAMUSCULAR | Status: DC | PRN
  Administered 2022-03-02: 1.5 mg/kg/h via INTRAVENOUS

## 2022-03-02 MED ORDER — PROPOFOL 10 MG/ML IV BOLUS
INTRAVENOUS | Status: AC
  Filled 2022-03-02: qty 20

## 2022-03-02 MED ORDER — BUPIVACAINE HCL 0.25 % IJ SOLN
INTRAMUSCULAR | Status: AC
  Filled 2022-03-02: qty 1

## 2022-03-02 MED ORDER — ACETAMINOPHEN 160 MG/5ML PO SOLN: 325.0000 mg | ORAL | Status: DC | PRN

## 2022-03-02 MED ORDER — MIDAZOLAM HCL 2 MG/2ML IJ SOLN
INTRAMUSCULAR | Status: AC
  Filled 2022-03-02: qty 2

## 2022-03-02 MED ORDER — CHLORHEXIDINE GLUCONATE 0.12 % MT SOLN
15.0000 mL | Freq: Once | OROMUCOSAL | Status: AC
  Administered 2022-03-02: 15 mL via OROMUCOSAL

## 2022-03-02 MED ORDER — DIPHENHYDRAMINE HCL 50 MG/ML IJ SOLN
INTRAMUSCULAR | Status: DC | PRN
  Administered 2022-03-02: 12.5 mg via INTRAVENOUS

## 2022-03-02 MED ORDER — FENTANYL CITRATE PF 50 MCG/ML IJ SOSY
PREFILLED_SYRINGE | INTRAMUSCULAR | Status: AC
  Administered 2022-03-02: 25 ug via INTRAVENOUS
  Filled 2022-03-02: qty 3

## 2022-03-02 MED ORDER — OXYCODONE HCL 5 MG PO TABS
ORAL_TABLET | ORAL | Status: AC
  Filled 2022-03-02: qty 1

## 2022-03-02 MED ORDER — SUCCINYLCHOLINE CHLORIDE 200 MG/10ML IV SOSY
PREFILLED_SYRINGE | INTRAVENOUS | Status: AC
  Filled 2022-03-02: qty 10

## 2022-03-02 MED ORDER — SCOPOLAMINE 1 MG/3DAYS TD PT72
1.0000 | MEDICATED_PATCH | TRANSDERMAL | Status: DC
  Administered 2022-03-02: 1.5 mg via TRANSDERMAL
  Filled 2022-03-02: qty 1

## 2022-03-02 MED ORDER — SUGAMMADEX SODIUM 200 MG/2ML IV SOLN
INTRAVENOUS | Status: DC | PRN
  Administered 2022-03-02: 200 mg via INTRAVENOUS

## 2022-03-02 MED ORDER — AMISULPRIDE (ANTIEMETIC) 5 MG/2ML IV SOLN: 10.0000 mg | Freq: Once | INTRAVENOUS | Status: DC | PRN

## 2022-03-02 MED ORDER — LIDOCAINE HCL (PF) 2 % IJ SOLN
INTRAMUSCULAR | Status: AC
  Filled 2022-03-02: qty 20

## 2022-03-02 MED ORDER — CIPROFLOXACIN IN D5W 400 MG/200ML IV SOLN
400.0000 mg | INTRAVENOUS | Status: AC
  Administered 2022-03-02: 400 mg via INTRAVENOUS
  Filled 2022-03-02: qty 200

## 2022-03-02 MED ORDER — OXYCODONE HCL 5 MG PO TABS: 5.0000 mg | ORAL_TABLET | Freq: Once | ORAL | Status: DC | PRN

## 2022-03-02 MED ORDER — DIPHENHYDRAMINE HCL 50 MG/ML IJ SOLN
INTRAMUSCULAR | Status: AC
  Filled 2022-03-02: qty 1

## 2022-03-02 MED ORDER — ORAL CARE MOUTH RINSE: 15.0000 mL | Freq: Once | OROMUCOSAL | Status: AC

## 2022-03-02 MED ORDER — ROCURONIUM BROMIDE 10 MG/ML (PF) SYRINGE
PREFILLED_SYRINGE | INTRAVENOUS | Status: DC | PRN
Start: 2022-03-02 — End: 2022-03-02
  Administered 2022-03-02: 20 mg via INTRAVENOUS
  Administered 2022-03-02: 50 mg via INTRAVENOUS

## 2022-03-02 MED ORDER — LACTATED RINGERS IV SOLN: INTRAVENOUS | Status: DC

## 2022-03-02 SURGICAL SUPPLY — 73 items
APPLICATOR SURGIFLO ENDO (HEMOSTASIS) IMPLANT
BACTOSHIELD CHG 4% 4OZ (MISCELLANEOUS) ×1
BAG LAPAROSCOPIC 12 15 PORT 16 (BASKET) IMPLANT
BAG RETRIEVAL 10 (BASKET)
BAG RETRIEVAL 12/15 (BASKET)
BLADE SURG SZ10 CARB STEEL (BLADE) IMPLANT
COVER BACK TABLE 60X90IN (DRAPES) ×2 IMPLANT
COVER TIP SHEARS 8 DVNC (MISCELLANEOUS) ×1 IMPLANT
COVER TIP SHEARS 8MM DA VINCI (MISCELLANEOUS) ×1
DERMABOND ADVANCED (GAUZE/BANDAGES/DRESSINGS) ×1
DERMABOND ADVANCED .7 DNX12 (GAUZE/BANDAGES/DRESSINGS) ×1 IMPLANT
DRAPE ARM DVNC X/XI (DISPOSABLE) ×4 IMPLANT
DRAPE COLUMN DVNC XI (DISPOSABLE) ×1 IMPLANT
DRAPE DA VINCI XI ARM (DISPOSABLE) ×4
DRAPE DA VINCI XI COLUMN (DISPOSABLE) ×1
DRAPE SHEET LG 3/4 BI-LAMINATE (DRAPES) ×2 IMPLANT
DRAPE SURG IRRIG POUCH 19X23 (DRAPES) ×2 IMPLANT
DRSG OPSITE POSTOP 4X6 (GAUZE/BANDAGES/DRESSINGS) IMPLANT
DRSG OPSITE POSTOP 4X8 (GAUZE/BANDAGES/DRESSINGS) IMPLANT
ELECT PENCIL ROCKER SW 15FT (MISCELLANEOUS) IMPLANT
ELECT REM PT RETURN 15FT ADLT (MISCELLANEOUS) ×2 IMPLANT
GAUZE 4X4 16PLY ~~LOC~~+RFID DBL (SPONGE) ×2 IMPLANT
GLOVE BIO SURGEON STRL SZ 6 (GLOVE) ×8 IMPLANT
GLOVE BIO SURGEON STRL SZ 6.5 (GLOVE) ×4 IMPLANT
GOWN STRL REUS W/ TWL LRG LVL3 (GOWN DISPOSABLE) ×4 IMPLANT
GOWN STRL REUS W/TWL LRG LVL3 (GOWN DISPOSABLE) ×4
HOLDER FOLEY CATH W/STRAP (MISCELLANEOUS) IMPLANT
IRRIG SUCT STRYKERFLOW 2 WTIP (MISCELLANEOUS) ×2
IRRIGATION SUCT STRKRFLW 2 WTP (MISCELLANEOUS) ×1 IMPLANT
KIT PROCEDURE DA VINCI SI (MISCELLANEOUS)
KIT PROCEDURE DVNC SI (MISCELLANEOUS) IMPLANT
KIT TURNOVER KIT A (KITS) IMPLANT
LIGASURE IMPACT 36 18CM CVD LR (INSTRUMENTS) IMPLANT
MANIPULATOR ADVINCU DEL 3.0 PL (MISCELLANEOUS) IMPLANT
MANIPULATOR ADVINCU DEL 3.5 PL (MISCELLANEOUS) IMPLANT
MANIPULATOR UTERINE 4.5 ZUMI (MISCELLANEOUS) IMPLANT
NDL HYPO 21X1.5 SAFETY (NEEDLE) ×1 IMPLANT
NDL SPNL 18GX3.5 QUINCKE PK (NEEDLE) IMPLANT
NEEDLE HYPO 21X1.5 SAFETY (NEEDLE) ×2 IMPLANT
NEEDLE SPNL 18GX3.5 QUINCKE PK (NEEDLE) IMPLANT
OBTURATOR OPTICAL STANDARD 8MM (TROCAR) ×1
OBTURATOR OPTICAL STND 8 DVNC (TROCAR) ×1
OBTURATOR OPTICALSTD 8 DVNC (TROCAR) ×1 IMPLANT
PACK ROBOT GYN CUSTOM WL (TRAY / TRAY PROCEDURE) ×2 IMPLANT
PAD POSITIONING PINK XL (MISCELLANEOUS) ×2 IMPLANT
PORT ACCESS TROCAR AIRSEAL 12 (TROCAR) ×1 IMPLANT
PORT ACCESS TROCAR AIRSEAL 5M (TROCAR) ×1
SCRUB CHG 4% DYNA-HEX 4OZ (MISCELLANEOUS) ×1 IMPLANT
SEAL CANN UNIV 5-8 DVNC XI (MISCELLANEOUS) ×4 IMPLANT
SEAL XI 5MM-8MM UNIVERSAL (MISCELLANEOUS) ×4
SET TRI-LUMEN FLTR TB AIRSEAL (TUBING) ×2 IMPLANT
SPIKE FLUID TRANSFER (MISCELLANEOUS) ×2 IMPLANT
SPONGE T-LAP 18X18 ~~LOC~~+RFID (SPONGE) IMPLANT
SURGIFLO W/THROMBIN 8M KIT (HEMOSTASIS) IMPLANT
SUT MNCRL AB 4-0 PS2 18 (SUTURE) IMPLANT
SUT PDS AB 1 TP1 96 (SUTURE) IMPLANT
SUT VIC AB 0 CT1 27 (SUTURE)
SUT VIC AB 0 CT1 27XBRD ANTBC (SUTURE) IMPLANT
SUT VIC AB 2-0 CT1 27 (SUTURE)
SUT VIC AB 2-0 CT1 TAPERPNT 27 (SUTURE) IMPLANT
SUT VIC AB 4-0 PS2 18 (SUTURE) ×4 IMPLANT
SYR 10ML LL (SYRINGE) IMPLANT
SYS BAG RETRIEVAL 10MM (BASKET)
SYS WOUND ALEXIS 18CM MED (MISCELLANEOUS)
SYSTEM BAG RETRIEVAL 10MM (BASKET) IMPLANT
SYSTEM WOUND ALEXIS 18CM MED (MISCELLANEOUS) IMPLANT
TOWEL OR NON WOVEN STRL DISP B (DISPOSABLE) IMPLANT
TRAP SPECIMEN MUCUS 40CC (MISCELLANEOUS) IMPLANT
TRAY FOLEY MTR SLVR 16FR STAT (SET/KITS/TRAYS/PACK) ×2 IMPLANT
TROCAR XCEL NON-BLD 5MMX100MML (ENDOMECHANICALS) IMPLANT
UNDERPAD 30X36 HEAVY ABSORB (UNDERPADS AND DIAPERS) ×4 IMPLANT
WATER STERILE IRR 1000ML POUR (IV SOLUTION) ×2 IMPLANT
YANKAUER SUCT BULB TIP 10FT TU (MISCELLANEOUS) IMPLANT

## 2022-03-02 NOTE — Op Note (Signed)
OPERATIVE NOTE ? ?Pre-operative Diagnosis: ER+ breast cancer, thickened endometrium with difficulty sampling pre-op, simple adnexal cyst ? ?Post-operative Diagnosis: same, disordered proliferative endometrium (no hyperplasia or malignancy appreciated on frozen), no ovarian cyst ? ?Operation: Robotic-assisted laparoscopic total hysterectomy with bilateral salpingo-oophorectomy ? ?Surgeon: Jeral Pinch MD ? ?Assistant Surgeon: Lahoma Crocker MD (an MD assistant was necessary for tissue manipulation, management of robotic instrumentation, retraction and positioning due to the complexity of the case and hospital policies).  ? ?Anesthesia: GET ? ?Urine Output: 400cc ? ?Operative Findings: On EUA, mobile uterus, no adnexal masses. On intra-abdominal entry, normal upper abdominal survey including liver edge, diaphragm and stomach. Normal omentum, small and large bowel. Uterus 8cm and normal appearing. Normal appearing bilateral adnexa. No ascites. No intra-abdominal or pelvic evidence of disease. On frozen section, no discrete lesion noted. Thickened endometrium sampled and disordered endometrium noted. ? ?Estimated Blood Loss:  less than 50 mL     ? ?Total IV Fluids: see I&O flowsheet ?        ?Specimens: uterus, cervix, bilateral tubes and ovaries ?        ?Complications:  None apparent; patient tolerated the procedure well. ?        ?Disposition: PACU - hemodynamically stable. ? ?Procedure Details  ?The patient was seen in the Holding Room. The risks, benefits, complications, treatment options, and expected outcomes were discussed with the patient.  The patient concurred with the proposed plan, giving informed consent.  The site of surgery properly noted/marked. The patient was identified as Marketing executive and the procedure verified as a Robotic-assisted hysterectomy with bilateral salpingo oophorectomy with SLN biopsy.  ? ?After induction of anesthesia, the patient was draped and prepped in the usual sterile  manner. Patient was placed in supine position after anesthesia and draped and prepped in the usual sterile manner as follows: Her arms were tucked to her side with all appropriate precautions.  The shoulders were stabilized with padded shoulder blocks applied to the acromium processes.  The patient was placed in the semi-lithotomy position in [place].  The perineum and vagina were prepped with CholoraPrep. The patient was draped after the CholoraPrep had been allowed to dry for 3 minutes.  A Time Out was held and the above information confirmed. ? ?The urethra was prepped with Betadine. Foley catheter was placed.  A sterile speculum was placed in the vagina.  The cervix was grasped with a single-tooth tenaculum. The cervix was dilated with Kennon Rounds dilators.  The ZUMI uterine manipulator with a medium colpotomizer ring was placed with some difficulty as the uterus sound and dilators could not be passed past 5 cm.  A pneum occluder balloon was placed over the manipulator.  OG tube placement was confirmed and to suction.  ? ?Next, a 10 mm skin incision was made 1 cm below the subcostal margin in the midclavicular line.  The 5 mm Optiview port and scope was used for direct entry.  Opening pressure was under 10 mm CO2.  The abdomen was insufflated and the findings were noted as above.   At this point and all points during the procedure, the patient's intra-abdominal pressure did not exceed 15 mmHg. Next, an 8 mm skin incision was made superior to the umbilicus and a right and left port were placed about 8 cm lateral to the robot port on the right and left side.  A fourth arm was placed on the right.  The 5 mm assist trocar was exchanged for a 10-12 mm  port. All ports were placed under direct visualization.  The patient was placed in steep Trendelenburg.  Bowel was folded away into the upper abdomen.  The robot was docked in the normal manner. ? ?The right and left peritoneum were opened parallel to the IP ligament  to open the retroperitoneal spaces bilaterally. The round ligaments were transected. The ureter was noted to be on the medial leaf of the broad ligament.  The peritoneum above the ureter was incised and stretched and the infundibulopelvic ligament was skeletonized, cauterized and cut.   ? ?The posterior peritoneum was taken down to the level of the KOH ring.  The anterior peritoneum was also taken down.  The bladder flap was created to the level of the KOH ring.  The uterine artery on the right side was skeletonized, cauterized and cut in the normal manner.  A similar procedure was performed on the left.  The colpotomy was made and the uterus, cervix, bilateral ovaries and tubes were amputated and delivered through the vagina.  Pedicles were inspected and excellent hemostasis was achieved.   ? ?The colpotomy at the vaginal cuff was closed with Vicryl on a CT1 needle in a running manner.  Irrigation was used and excellent hemostasis was achieved.  At this point in the procedure was completed.  Robotic instruments were removed under direct visulaization.  The robot was undocked. The fascia at the 10-12 mm port was closed with 0 Vicryl on a UR-5 needle.  The subcuticular tissue was closed with 4-0 Vicryl and the skin was closed with 4-0 Monocryl in a subcuticular manner.  Dermabond was applied.   ? ?The vagina was swabbed with minimal bleeding noted.  Foley catheter was removed.  All sponge, lap and needle counts were correct x  3.  ? ?The patient was transferred to the recovery room in stable condition. ? ?Jeral Pinch, MD ? ?

## 2022-03-02 NOTE — Interval H&P Note (Signed)
History and Physical Interval Note: ? ?03/02/2022 ?9:33 AM ? ?Monica Black  has presented today for surgery, with the diagnosis of OVARIAN CYST ?THICKENED ENDOMETRIUM ?HX OF BREAST CANCER.  The various methods of treatment have been discussed with the patient and family. After consideration of risks, benefits and other options for treatment, the patient has consented to  Procedure(s): ?XI ROBOTIC [place], POSSIBLE STAGING, POSSIBLE LAPAROTOMY (Bilateral) as a surgical intervention.  The patient's history has been reviewed, patient examined, no change in status, stable for surgery.  I have reviewed the patient's chart and labs.  Questions were answered to the patient's satisfaction.   ? ? ?[name] ? ? ?

## 2022-03-02 NOTE — Discharge Instructions (Signed)
AFTER SURGERY INSTRUCTIONS ?  ?Return to work: 4-6 weeks if applicable ?  ?Activity: ?1. Be up and out of the bed during the day.  Take a nap if needed.  You may walk up steps but be careful and use the hand rail.  Stair climbing will tire you more than you think, you may need to stop part way and rest.  ?  ?2. No lifting or straining for 6 weeks over 10 pounds. No pushing, pulling, straining for 6 weeks. ?  ?3. No driving for around 1 week(s).  Do not drive if you are taking narcotic pain medicine and make sure that your reaction time has returned.  ?  ?4. You can shower as soon as the next day after surgery. Shower daily.  Use your regular soap and water (not directly on the incision) and pat your incision(s) dry afterwards; don't rub.  No tub baths or submerging your body in water until cleared by your surgeon. If you have the soap that was given to you by pre-surgical testing that was used before surgery, you do not need to use it afterwards because this can irritate your incisions.  ?  ?5. No sexual activity and nothing in the vagina for 8 weeks. ?  ?6. You may experience a small amount of clear drainage from your incisions, which is normal.  If the drainage persists, increases, or changes color please call the office. ?  ?7. Do not use creams, lotions, or ointments such as neosporin on your incisions after surgery until advised by your surgeon because they can cause removal of the dermabond glue on your incisions.   ?  ?8. You may experience vaginal spotting after surgery or around the 6-8 week mark from surgery when the stitches at the top of the vagina begin to dissolve.  The spotting is normal but if you experience heavy bleeding, call our office. ?  ?9. Take Tylenol or ibuprofen first for pain and only use narcotic pain medication for severe pain not relieved by the Tylenol or Ibuprofen.  Monitor your Tylenol intake to a max of 4,000 mg in a 24 hour period. You can alternate these medications after  surgery. ?  ?Diet: ?1. Low sodium Heart Healthy Diet is recommended but you are cleared to resume your normal (before surgery) diet after your procedure. ?  ?2. It is safe to use a laxative, such as Miralax or Colace, if you have difficulty moving your bowels. You have been prescribed Sennakot-S to take at bedtime every evening after surgery to keep bowel movements regular and to prevent constipation.   ?  ?Wound Care: ?1. Keep clean and dry.  Shower daily. ?  ?Reasons to call the Doctor: ?Fever - Oral temperature greater than 100.4 degrees Fahrenheit ?Foul-smelling vaginal discharge ?Difficulty urinating ?Nausea and vomiting ?Increased pain at the site of the incision that is unrelieved with pain medicine. ?Difficulty breathing with or without chest pain ?New calf pain especially if only on one side ?Sudden, continuing increased vaginal bleeding with or without clots. ?  ?Contacts: ?For questions or concerns you should contact: ?  ?Dr. Jeral Pinch at [phone] ?  ?Joylene John, NP at [phone] ?  ?After Hours: call [phone] and have the GYN Oncologist paged/contacted (after 5 pm or on the weekends). ?  ?Messages sent via mychart are for non-urgent matters and are not responded to after hours so for urgent needs, please call the after hours number. ?  ?  ?

## 2022-03-02 NOTE — Anesthesia Preprocedure Evaluation (Addendum)
Anesthesia Evaluation  ?Patient identified by MRN, date of birth, ID band ?Patient awake ? ? ? ?Reviewed: ?Allergy & Precautions, NPO status , Patient's Chart, lab work & pertinent test results ? ?History of Anesthesia Complications ?(+) PONV and history of anesthetic complications ? ?Airway ?Mallampati: II ? ? ? ? ?Mouth opening: Limited Mouth Opening ? Dental ? ?(+) Teeth Intact, Dental Advisory Given, Caps ?  ?Pulmonary ?asthma ,  ?  ?Pulmonary exam normal ? ? ? ? ? ? ? Cardiovascular ?negative cardio ROS ? ? ?Rhythm:Regular Rate:Normal ? ? ?  ?Neuro/Psych ? Headaches, Anxiety   ? GI/Hepatic ?negative GI ROS, Neg liver ROS,   ?Endo/Other  ?negative endocrine ROS ? Renal/GU ?negative Renal ROS  ? ?  ?Musculoskeletal ? ?(+) Arthritis ,  ? Abdominal ?Normal abdominal exam  (+)   ?Peds ? Hematology ?negative hematology ROS ?(+)   ?Anesthesia Other Findings ? ? Reproductive/Obstetrics ? ?  ? ? ? ? ? ? ? ? ? ? ? ? ? ?  ?  ? ? ? ? ? ? ? ?Anesthesia Physical ?Anesthesia Plan ? ?ASA: 3 ? ?Anesthesia Plan: General  ? ?Post-op Pain Management:   ? ?Induction: Intravenous ? ?PONV Risk Score and Plan: 4 or greater and Ondansetron, Dexamethasone, Midazolam and Scopolamine patch - Pre-op ? ?Airway Management Planned: Oral ETT ? ?Additional Equipment: None ? ?Intra-op Plan:  ? ?Post-operative Plan: Extubation in OR ? ?Informed Consent: I have reviewed the patients History and Physical, chart, labs and discussed the procedure including the risks, benefits and alternatives for the proposed anesthesia with the patient or authorized representative who has indicated his/her understanding and acceptance.  ? ? ? ?Dental advisory given ? ?Plan Discussed with: CRNA ? ?Anesthesia Plan Comments:   ? ? ? ? ? ?Anesthesia Quick Evaluation ? ?

## 2022-03-02 NOTE — Anesthesia Procedure Notes (Signed)
Procedure Name: Intubation ?Date/Time: 03/02/2022 10:37 AM ?Performed by: Talbot Grumbling, CRNA ?Pre-anesthesia Checklist: Patient identified, Emergency Drugs available, Patient being monitored and Suction available ?Patient Re-evaluated:Patient Re-evaluated prior to induction ?Oxygen Delivery Method: Circle system utilized ?Preoxygenation: Pre-oxygenation with 100% oxygen ?Induction Type: IV induction ?Ventilation: Mask ventilation without difficulty ?Laryngoscope Size: Mac and 3 ?Grade View: Grade I ?Tube type: Oral ?Tube size: 7.0 mm ?Number of attempts: 1 ?Airway Equipment and Method: Stylet ?Placement Confirmation: ETT inserted through vocal cords under direct vision, positive ETCO2 and breath sounds checked- equal and bilateral ?Secured at: 21 cm ?Tube secured with: Tape ?Dental Injury: Teeth and Oropharynx as per pre-operative assessment  ? ? ? ? ?

## 2022-03-02 NOTE — Transfer of Care (Signed)
Immediate Anesthesia Transfer of Care Note ? ?Patient: Monica Black ? ?Procedure(s) Performed: XI ROBOTIC ASSISTED TOTAL HYSTERECTOMY WITH BILATERAL SALPINGO OOPHORECTOMY (Bilateral: Abdomen) ? ?Patient Location: PACU ? ?Anesthesia Type:General ? ?Level of Consciousness: sedated and comatose ? ?Airway & Oxygen Therapy: Patient Spontanous Breathing and Patient connected to face mask oxygen ? ?Post-op Assessment: Report given to RN and Post -op Vital signs reviewed and stable ? ?Post vital signs: Reviewed and stable ? ?Last Vitals:  ?Vitals Value Taken Time  ?BP 108/68 03/02/22 1239  ?Temp    ?Pulse 71 03/02/22 1241  ?Resp 6 03/02/22 1241  ?SpO2 100 % 03/02/22 1241  ?Vitals shown include unvalidated device data. ? ?Last Pain:  ?Vitals:  ? 03/02/22 0906  ?TempSrc:   ?PainSc: 6   ?   ? ?Patients Stated Pain Goal: 3 (03/02/22 1747) ? ?Complications: No notable events documented. ?

## 2022-03-02 NOTE — Anesthesia Postprocedure Evaluation (Signed)
Anesthesia Post Note ? ?Patient: Monica Black ? ?Procedure(s) Performed: XI ROBOTIC ASSISTED TOTAL HYSTERECTOMY WITH BILATERAL SALPINGO OOPHORECTOMY (Bilateral: Abdomen) ? ?  ? ?Patient location during evaluation: PACU ?Anesthesia Type: General ?Level of consciousness: awake and alert ?Pain management: pain level controlled ?Vital Signs Assessment: post-procedure vital signs reviewed and stable ?Respiratory status: spontaneous breathing, nonlabored ventilation, respiratory function stable and patient connected to nasal cannula oxygen ?Cardiovascular status: blood pressure returned to baseline and stable ?Postop Assessment: no apparent nausea or vomiting ?Anesthetic complications: no ? ? ?No notable events documented. ? ?Last Vitals:  ?Vitals:  ? 03/02/22 1345 03/02/22 1410  ?BP: 115/77 100/64  ?Pulse: 70 75  ?Resp: (!) 9   ?Temp:    ?SpO2: 99% 98%  ?  ?Last Pain:  ?Vitals:  ? 03/02/22 1410  ?TempSrc:   ?PainSc: 6   ? ? ?  ?  ?  ?  ?  ?  ? ?Effie Berkshire ? ? ? ? ?

## 2022-03-03 ENCOUNTER — Encounter (HOSPITAL_COMMUNITY): Payer: Self-pay | Admitting: Gynecologic Oncology

## 2022-03-03 ENCOUNTER — Telehealth: Payer: Self-pay | Admitting: *Deleted

## 2022-03-03 NOTE — Telephone Encounter (Signed)
Spoke with Ms. Crisp this morning. She states she is eating, drinking and urinating well. She has not had a BM yet and is not passing gas. She mentioned pain in her shoulder. Educated pt on gas pain and movement and walking to help with the pain. She is taking senokot as prescribed and encouraged her to drink plenty of water, and miralax. She denies fever or chills. Incisions are dry and intact. She rates her pain 6/10. Her pain is controlled with ibuprofen and oxy.   ? ?Instructed to call office with any fever, chills, purulent drainage, uncontrolled pain or any other questions or concerns. Patient verbalizes understanding.  ? ?Pt aware of post op appointments as well as the office number [phone] and after hours number [phone] to call if she has any questions or concerns  ?

## 2022-03-04 LAB — SURGICAL PATHOLOGY

## 2022-03-11 ENCOUNTER — Ambulatory Visit: Payer: Commercial Managed Care - PPO | Admitting: Hematology and Oncology

## 2022-03-11 ENCOUNTER — Encounter: Payer: Commercial Managed Care - PPO | Admitting: Primary Care

## 2022-03-12 ENCOUNTER — Other Ambulatory Visit: Payer: Self-pay

## 2022-03-12 ENCOUNTER — Encounter: Payer: Self-pay | Admitting: Hematology and Oncology

## 2022-03-12 ENCOUNTER — Inpatient Hospital Stay: Payer: Commercial Managed Care - PPO | Attending: Gynecologic Oncology | Admitting: Hematology and Oncology

## 2022-03-12 VITALS — BP 121/77 | HR 79 | Temp 97.9°F | Resp 16 | Ht 60.0 in | Wt 164.1 lb

## 2022-03-12 DIAGNOSIS — Z923 Personal history of irradiation: Secondary | ICD-10-CM | POA: Insufficient documentation

## 2022-03-12 DIAGNOSIS — Z853 Personal history of malignant neoplasm of breast: Secondary | ICD-10-CM | POA: Insufficient documentation

## 2022-03-12 DIAGNOSIS — Z9013 Acquired absence of bilateral breasts and nipples: Secondary | ICD-10-CM | POA: Diagnosis not present

## 2022-03-12 DIAGNOSIS — Z9221 Personal history of antineoplastic chemotherapy: Secondary | ICD-10-CM | POA: Insufficient documentation

## 2022-03-12 NOTE — Progress Notes (Signed)
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   Monica Black arrived to the appointment today by herself.  Since last visit she had hysterectomy which showed benign pathology.  She is still very reluctant to consider taking antiestrogen therapy for extended time.  She also had bilateral oophorectomy when she had her hysterectomy which contributes to ovarian suppression.  She tells me that she did not feel well when she took tamoxifen, she always felt tired as if she has no energy, did not quite feel like herself and she is very reluctant to go back on antiestrogen therapy be tamoxifen or letrozole.  She denies any new health complaints today.  She had bilateral mastectomies without reconstruction.  Rest of the pertinent 10 point ROS reviewed and negative    COVID 19 VACCINATION STATUS: Refuses vaccination   HISTORY OF CURRENT ILLNESS: From the original intake note:  Monica Black has a history of breast cancer dating back to 2017. She moved from [place] to [place] in 07/2017 and transferred her care to [facility] in [place], [place].   She had screening mammogram in June 2017 showing microcalcifications in the right breast. She proceeded to biopsy that month that showed by her recollection "stage 0" cancer. She tells me she had a PET scan at that time which did not show any adenopathy. Nevertheless when  she proceeded to right lumpectomy with sentinel lymph node dissection on 05/18/2016, the pathology showed: invasive ductal carcinoma, 6-8 cm, grade 3; lymphovascular invasion present; positive margins; 6 of 21 axillary lymph nodes positive for malignancy; estrogen receptor 100% positive, progesterone receptor 40% positive, Her2 negative (0).   She was subsequently treated with chemotherapy consisting of dose dense AC x4 cycles. She then received one dose of weekly taxol, but she had an infusion reaction. She was switched to taxotere and completed 3 cycles given 21 days apart on 09/15/2016.  She opted to undergo bilateral mastectomies with left sentinel lymph node biopsy on 10/08/2016 but decided against immediate DIEP reconstruction.. Final pathology [id number]) revealed right breast multifocal residual invasive ductal carcinoma, with 2 foci measuring 0.5 cm each, grade 3; lymphovascular invasion present; margins negative. Left breast and the single left sentinel lymph node were negative for malignancy.   She was then placed on tamoxifen, as well as Xeloda for 2 weeks on and 1 week off, in 10/2016. She received concurrent radiation therapy from 10/2016 through 12/27/2016. She completed 8 cycles of Xeloda on 04/16/2017.  Genetic testing was also performed while she was in [place], which showed no pathogenic mutations.  Although her periods never recurred, she was switched to Zoladex/letrozole on 10/11/2017. She is tolerating this well.  The patient's subsequent history is as detailed below.   PAST MEDICAL HISTORY: Past Medical History:  Diagnosis Date   Anxiety    Arthritis    Asthma    mild no meds   Chemotherapy-induced neuropathy (HCC)    Chronic constipation    per pt intermittant   Complication of anesthesia  Headache    migraines   History of cancer chemotherapy    right breast  completed 09-15-2016  and again completed 06/ 2018 after mastectomies   History of external beam  radiation therapy    right breast  01/ 2018  to 03/ 2018   Malignant neoplasm of overlapping sites of right breast in female, estrogen receptor positive [facility]) 2017   oncologist--- previously was dr Jana Hakim , new one is dr Mamie Nick. Chryl Heck;  05-18-2016  s/p right breast lumpectomy w/ node dissection's; local advanced positive / 6 positive nodes  IDC,  Stage III,  ER/PR+,  completed chemo 11/ 2017;;  10-08-2016  s/p bilateral mastectomies w/ node dissection ,  residual IDC, left breast no maligancy,  completed chemo 06/ 2018 and radiation 03/ 2018   Pneumonia    PONV (postoperative nausea and vomiting)    Pre-diabetes    Thickened endometrium     PAST SURGICAL HISTORY: Past Surgical History:  Procedure Laterality Date   BREAST LUMPECTOMY WITH AXILLARY LYMPH NODE DISSECTION Right 05/18/2016   DILATATION & CURETTAGE/HYSTEROSCOPY WITH MYOSURE N/A 01/26/2022   Procedure: DILATATION & CURETTAGE/HYSTEROSCOPY WITH MYOSURE;  Surgeon: [name], MD;  Location: [facility];  Service: Gynecology;  Laterality: N/A;   ELBOW HARDWARE REMOVAL Left 2013   MODIFIED RADICAL MASTECTOMY W/ AXILLARY LYMPH NODE DISSECTION Bilateral 10/08/2016   ORIF ELBOW FRACTURE Left 2012   ROBOTIC ASSISTED TOTAL HYSTERECTOMY WITH BILATERAL SALPINGO OOPHERECTOMY Bilateral 03/02/2022   Procedure: XI ROBOTIC ASSISTED TOTAL HYSTERECTOMY WITH BILATERAL SALPINGO OOPHORECTOMY;  Surgeon: [name], MD;  Location: [facility];  Service: Gynecology;  Laterality: Bilateral;  Status post left elbow surgery   FAMILY HISTORY: Family History  Problem Relation Age of Onset   Other Mother        adopted, family history unk   Asthma Father    Brain cancer Paternal Grandfather    Parkinson's disease Paternal Uncle    Breast cancer Neg Hx    Ovarian cancer Neg Hx   The patient's mother was adopted and has no information regarding her biologic family. She is 41 years old as of December 2020. The patient's father is 53  years old as of December 2020. He has a history of asthma. His father had brain cancer. The patient's father had 1 brother, with Parkinson's disease, and 2 sisters, 1 of whom died at a young age. There is no history of breast or ovarian cancer on his side of the family. The patient himself has 2 brothers, aged 31 and 58 as of December 2020. She is not aware of any other cancer history in the family   GYNECOLOGIC HISTORY:  The patient thinks her last menstrual period was around October 2015. Her periods had been scant and irregular prior to that. From her marriage November 2015 through the start of chemo August 2017 (21 months) she and her husband used no contraceptives and she did not get pregnant. Menarche: 40 years old [place] P 0 LMP 04/2016 Contraceptive used for 1 year HRT n/a  Hysterectomy? no BSO? no   SOCIAL HISTORY: (updated 09/2019)  Monica Black is currently working part-time at [employer] Her husband Aaron Edelman works for [employer] doing analysis of [employer]. They attend [employer]    ADVANCED DIRECTIVES: In the absence of any documentation to the contrary, the patient's spouse is their HCPOA.   HEALTH MAINTENANCE: Social History   Tobacco Use   Smoking status: Never   Smokeless tobacco: Never  Vaping  Use   Vaping Use: Never used  Substance Use Topics   Alcohol use: No   Drug use: Never     Colonoscopy: n/a  PAP: 04/2018, negative  Bone density: 2019?   Allergies  Allergen Reactions   Penicillins Hives   Tylenol [Acetaminophen] Itching   Latex Rash    Current Outpatient Medications  Medication Sig Dispense Refill   cetirizine (ZYRTEC) 10 MG tablet Take 10 mg by mouth daily as needed for allergies.     ibuprofen (ADVIL) 800 MG tablet Take 1 tablet (800 mg total) by mouth every 8 (eight) hours as needed for moderate pain. For AFTER surgery only 30 tablet 0   Multiple Vitamin (MULTIVITAMIN) LIQD Take 30 mLs by mouth daily.     oxyCODONE (OXY  IR/ROXICODONE) 5 MG immediate release tablet Take 1 tablet (5 mg total) by mouth every 4 (four) hours as needed for severe pain. For AFTER surgery only, do not take and drive 15 tablet 0   senna-docusate (SENOKOT-S) 8.6-50 MG tablet Take 2 tablets by mouth at bedtime. For AFTER surgery, do not take if having diarrhea 30 tablet 0   No current facility-administered medications for this visit.    OBJECTIVE: white woman in no acute distress  Vitals:   03/12/22 1344  BP: 121/77  Pulse: 79  Resp: 16  Temp: 97.9 F (36.6 C)  SpO2: 100%      Body mass index is 32.05 kg/m.   Wt Readings from Last 3 Encounters:  03/12/22 164 lb 1.6 oz (74.4 kg)  02/24/22 164 lb (74.4 kg)  01/26/22 164 lb (74.4 kg)      ECOG FS:1 - Symptomatic but completely ambulatory  Physical Exam Constitutional:      Appearance: Normal appearance.  Chest:     Comments: Bilateral mastectomies.  No concern for recurrence.  No regional adenopathy Musculoskeletal:        General: Normal range of motion.     Cervical back: Normal range of motion and neck supple. No rigidity.  Lymphadenopathy:     Cervical: No cervical adenopathy.  Neurological:     Mental Status: She is alert.      LAB RESULTS:  CMP     Component Value Date/Time   NA 140 02/24/2022 0844   K 4.2 02/24/2022 0844   CL 107 02/24/2022 0844   CO2 27 02/24/2022 0844   GLUCOSE 121 (H) 02/24/2022 0844   BUN 15 02/24/2022 0844   CREATININE 0.72 02/24/2022 0844   CREATININE 0.90 10/01/2019 1530   CALCIUM 9.3 02/24/2022 0844   PROT 7.6 02/24/2022 0844   ALBUMIN 4.3 02/24/2022 0844   AST 23 02/24/2022 0844   AST 20 10/01/2019 1530   ALT 21 02/24/2022 0844   ALT 20 10/01/2019 1530   ALKPHOS 44 02/24/2022 0844   BILITOT 0.7 02/24/2022 0844   BILITOT 0.4 10/01/2019 1530   GFRNONAA >60 02/24/2022 0844   GFRNONAA >60 10/01/2019 1530   GFRAA >60 03/26/2020 1321   GFRAA >60 10/01/2019 1530    No results found for: TOTALPROTELP, ALBUMINELP,  A1GS, A2GS, BETS, BETA2SER, GAMS, MSPIKE, SPEI  No results found for: KPAFRELGTCHN, LAMBDASER, KAPLAMBRATIO  Lab Results  Component Value Date   WBC 5.5 02/24/2022   NEUTROABS 2.2 08/25/2021   HGB 13.3 02/24/2022   HCT 39.3 02/24/2022   MCV 90.3 02/24/2022   PLT 338 02/24/2022   No results found for: LABCA2  No components found for: EHUDJS970  No results for input(s): INR in the  last 168 hours.  No results found for: LABCA2  No results found for: BHA193  No results found for: XTK240  No results found for: XBD532  No results found for: CA2729  No components found for: HGQUANT  No results found for: CEA1 / No results found for: CEA1   No results found for: AFPTUMOR  No results found for: CHROMOGRNA  No results found for: HGBA, HGBA2QUANT, HGBFQUANT, HGBSQUAN (Hemoglobinopathy evaluation)   No results found for: LDH  No results found for: IRON, TIBC, IRONPCTSAT (Iron and TIBC)  No results found for: FERRITIN  Urinalysis    Component Value Date/Time   COLORURINE STRAW (A) 05/01/2018 2012   APPEARANCEUR CLEAR (A) 05/01/2018 2012   LABSPEC 1.003 (L) 05/01/2018 2012   PHURINE 6.0 05/01/2018 2012   GLUCOSEU NEGATIVE 05/01/2018 2012   HGBUR SMALL (A) 05/01/2018 2012   [place] NEGATIVE 05/01/2018 2012   [place] NEGATIVE 05/01/2018 2012   [place] NEGATIVE 05/01/2018 2012   NITRITE NEGATIVE 05/01/2018 2012   LEUKOCYTESUR SMALL (A) 05/01/2018 2012    STUDIES: No results found.    ELIGIBLE FOR AVAILABLE RESEARCH PROTOCOL: no  ASSESSMENT: 41 y.o. [place], [place] woman status post right breast overlapping sites lumpectomy and sentinel lymph node biopsy June 2017 (and subsequent axillary lymph node dissection 05/18/2016) showing a pT3 pN2, stage IIIA invasive ductal carcinoma, grade 3, estrogen and progesterone receptor positive, HER-2 not amplified.  (a) a total of 21 axillary lymph nodes removed, 6 of them positive  (1) [neo]adjuvant chemotherapy  consisted of dose dense cyclophosphamide and doxorubicin x4 followed by paclitaxel weekly x1, with the paclitaxel discontinued because of an infusion reaction  (a) docetaxel x3 Q 21 days completed 09/15/2016.  (2) status post bilateral mastectomies and left sentinel lymph node sampling 10/08/2016 showing  (a) on the left no evidence of malignancy; single sentinel lymph node removed  (b) on the right, residual multifocal invasive ductal carcinoma, mpT1a, grade 3, with positive lymphovascular invasion but negative margins  (c) repeat prognostic panel: Estrogen receptor 100% positive, progesterone receptor 50% positive, HER-2 not amplified, MIB-1 was 20%  (3) adjuvant radiation given between January 2018 and 12/27/2016  (4) adjuvant capecitabine (2 weeks on, 1 week off) started January 2018 and completed 8 cycles 04/16/2017  (5) tamoxifen started January 2018  (a) changed to letrozole/goserelin January 2019  (b) baseline DEXA scan May 2019 normal  (c) letrozole/goserelin discontinued 01/01/2020 at patient's preference  (6) negative genetics testing [[place] 2017]  through [facility] myRisk gene panel  (7) considering bilateral salpingo-oophorectomy  (8) considering denosumab or zoledronate  (a) restaging studies at [facility] 03/22/2019 included brain MRI, bone scan and CT scans of the chest abdomen and pelvis showed no evidence of metastatic disease but there was a new right anterior fifth rib fracture and 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  (9) tamoxifen resumed 01/01/2020, completed 5 yrs of anti estrogen therapy   PLAN: Lus is coming up on 6 years from definitive surgery for her breast cancer with no evidence of disease recurrence.    We have once again discussed today about role of extended endocrine therapy.  I completely agree with Dr. Jana Hakim about considering 10 years of antiestrogen therapy however patient is very reluctant to  consider tamoxifen or any form of aromatase inhibitors.  Since she had bilateral oophorectomy, hopefully this ovarian suppression contributes to antiestrogen effect.  She understands that when breast cancer returns, this will most likely return as distant metastatic  disease which unfortunately is not curable.  She however is very reluctant since she did not have a good quality of life on tamoxifen.  She is hopeful to be cured, will keep [place] informed of any new symptoms or concerns.  She was strongly advised to call us with any concerns ,otherwise she will return to clinic in 1 year.  No role for mammograms, patient had bilateral mastectomies Total time spent: 30 minutes  Benay Pike, MD   03/12/2022 2:02 PM Medical Oncology and Hematology [facility] [place], [place] [address] Tel. [phone]    Fax. [phone]   This document serves as a record of services personally performed by Lurline Del, MD. It was created on his behalf by Wilburn Mylar, a trained medical scribe. The creation of this record is based on the scribe's personal observations and the provider's statements to them.   I, Lurline Del MD, have reviewed the above documentation for accuracy and completeness, and I agree with the above.   *Total Encounter Time as defined by the Centers for Medicare and Medicaid Services includes, in addition to the face-to-face time of a patient visit (documented in the note above) non-face-to-face time: obtaining and reviewing outside history, ordering and reviewing medications, tests or procedures, care coordination (communications with other health care professionals or caregivers) and documentation in the medical record.

## 2022-03-15 ENCOUNTER — Inpatient Hospital Stay: Payer: Commercial Managed Care - PPO | Admitting: Gynecologic Oncology

## 2022-03-15 ENCOUNTER — Telehealth: Payer: Self-pay | Admitting: *Deleted

## 2022-03-15 NOTE — Telephone Encounter (Signed)
Attempted to check in and follow up with pt. Unable to reach pt. LVM for return call

## 2022-03-15 NOTE — Telephone Encounter (Signed)
Spoke with patient and let her know that per Dr. Berline Lopes, no phone visit is needed today. Today's visit was to review pathology results and Dr. Berline Lopes has already reviewed this with her. Plan to follow up as scheduled 03/26/22 or sooner if needed. Patient verbalized understanding.

## 2022-03-16 ENCOUNTER — Encounter: Payer: Commercial Managed Care - PPO | Admitting: Primary Care

## 2022-03-23 ENCOUNTER — Encounter: Payer: Self-pay | Admitting: Gynecologic Oncology

## 2022-03-25 NOTE — Progress Notes (Unsigned)
Gynecologic Oncology Return Clinic Visit  03/26/22  Reason for Visit: follow-up after surgery  Treatment History: 12/01/2021: Pelvic ultrasound performed in the setting of cystic mass noted on lumbar MRI.  This showed a 4.9 cm simple appearing mass within the right adnexa.  3-63-monthfollow-up recommended.  Endometrial complex noted to be 11 mm and heterogenous, containing cystic foci and small amount of endometrial fluid. 01/26/22: Hysteroscopy with D&C.  Findings at the time of surgery included small mobile uterus.  Atrophic endocervix and endometrium with scarring noted.  No fluffy endometrial tissue appreciated.  Final pathology revealed only endocervical tissue and lower uterine segment tissue, no hyperplasia or malignancy. 03/02/22: TRH/BSO  Interval History: Doing well.  Continues to have some low back pain, unchanged compared to prior to surgery.  Denies any significant bleeding or discharge.  Had some constipation after surgery, bowel function is improving.  Denies any urinary symptoms.  Has not needed any narcotic pain medication since 5 days after surgery.  Careful not to do any heavy lifting.  Past Medical/Surgical History: Past Medical History:  Diagnosis Date   Anxiety    Arthritis    Asthma    mild no meds   Chemotherapy-induced neuropathy (HCC)    Chronic constipation    per pt intermittant   Complication of anesthesia    Headache    migraines   History of cancer chemotherapy    right breast  completed 09-15-2016  and again completed 06/ 2018 after mastectomies   History of external beam radiation therapy    right breast  01/ 2018  to 03/ 2018   Malignant neoplasm of overlapping sites of right breast in female, estrogen receptor positive [place]   oncologist--- previously was dr mJana Hakim, new one is dr pMamie Nick iChryl Heck  05-18-2016  s/p right breast lumpectomy w/ node dissection's; local advanced positive / 6 positive nodes  IDC,  Stage III,  ER/PR+,  completed chemo 11/ 2017;;   10-08-2016  s/p bilateral mastectomies w/ node dissection ,  residual IDC, left breast no maligancy,  completed chemo 06/ 2018 and radiation 03/ 2018   Pneumonia    PONV (postoperative nausea and vomiting)    Pre-diabetes    Thickened endometrium     Past Surgical History:  Procedure Laterality Date   BREAST LUMPECTOMY WITH AXILLARY LYMPH NODE DISSECTION Right 05/18/2016   DILATATION & CURETTAGE/HYSTEROSCOPY WITH MYOSURE N/A 01/26/2022   Procedure: DILATATION & CURETTAGE/HYSTEROSCOPY WITH MYOSURE;  Surgeon: TLafonda Mosses MD;  [facility] [facility]  Service: Gynecology;  Laterality: N/A;   ELBOW HARDWARE REMOVAL Left 2013   MODIFIED RADICAL MASTECTOMY W/ AXILLARY LYMPH NODE DISSECTION Bilateral 10/08/2016   ORIF ELBOW FRACTURE Left 2012   ROBOTIC ASSISTED TOTAL HYSTERECTOMY WITH BILATERAL SALPINGO OOPHERECTOMY Bilateral 03/02/2022   Procedure: XI ROBOTIC ASSISTED TOTAL HYSTERECTOMY WITH BILATERAL SALPINGO OOPHORECTOMY;  Surgeon: TLafonda Mosses MD;  Location: [facility];  Service: Gynecology;  Laterality: Bilateral;    Family History  Problem Relation Age of Onset   Other Mother        adopted, family history unk   Asthma Father    Brain cancer Paternal Grandfather    Parkinson's disease Paternal Uncle    Breast cancer Neg Hx    Ovarian cancer Neg Hx     Social History   Socioeconomic History   Marital status: Married    Spouse name: Not on file   Number of children: Not on file   Years of education: Not on  file   Highest education level: Not on file  Occupational History   Not on file  Tobacco Use   Smoking status: Never   Smokeless tobacco: Never  Vaping Use   Vaping Use: Never used  Substance and Sexual Activity   Alcohol use: No   Drug use: Never   Sexual activity: Yes    Birth control/protection: None  Other Topics Concern   Not on file  Social History Narrative   Married.   No children.   Will be working with [employer] through  the [place].   Enjoys shopping, movies, yoga.     Social Determinants of Health   Financial Resource Strain: Not on file  Food Insecurity: Not on file  Transportation Needs: Not on file  Physical Activity: Not on file  Stress: Not on file  Social Connections: Not on file    Current Medications:  Current Outpatient Medications:    cetirizine (ZYRTEC) 10 MG tablet, Take 10 mg by mouth daily as needed for allergies., Disp: , Rfl:    Multiple Vitamin (MULTIVITAMIN) LIQD, Take 30 mLs by mouth daily., Disp: , Rfl:   Review of Systems: Denies appetite changes, fevers, chills, fatigue, unexplained weight changes. Denies hearing loss, neck lumps or masses, mouth sores, ringing in ears or voice changes. Denies cough or wheezing.  Denies shortness of breath. Denies chest pain or palpitations. Denies leg swelling. Denies abdominal distention, pain, blood in stools, constipation, diarrhea, nausea, vomiting, or early satiety. Denies pain with intercourse, dysuria, frequency, hematuria or incontinence. Denies hot flashes, pelvic pain, vaginal bleeding or vaginal discharge.   Denies joint pain, back pain or muscle pain/cramps. Denies itching, rash, or wounds. Denies dizziness, headaches, numbness or seizures. Denies swollen lymph nodes or glands, denies easy bruising or bleeding. Denies anxiety, depression, confusion, or decreased concentration.  Physical Exam: BP 124/82 (BP Location: Left Arm, Patient Position: Sitting)   Pulse 80   Temp 98.4 F (36.9 C)   Resp 18   Ht 5' (1.524 m)   Wt 160 lb (72.6 kg)   LMP 02/24/2022   SpO2 99%   BMI 31.25 kg/m  General: Alert, oriented, no acute distress. HEENT: Normocephalic, atraumatic, sclera anicteric. Chest: Unlabored breathing on room air. Abdomen: soft, nontender.  Normoactive bowel sounds.  No masses or hepatosplenomegaly appreciated.  Well-healed incisions, Dermabond removed. Extremities: Grossly normal range of motion.   Warm, well perfused.  No edema bilaterally. GU: Normal appearing external genitalia without erythema, excoriation, or lesions.  Speculum exam reveals cuff intact, suture visible.  No bleeding or discharge.  Bimanual exam reveals cuff intact, no fluctuance or tenderness with palpation.    Laboratory & Radiologic Studies: A. UTERUS, CERVIX, BILATERAL FALLOPIAN TUBES AND OVARIES:  - Cervix      Unremarkable.      Negative for dysplasia or malignancy.  - Endometrium      Proliferative.      Negative for hyperplasia or malignancy.  - Myometrium      Unremarkable.      Negative for malignancy.  - Bilateral ovaries      Bilateral benign serous cysts.      Negative for endometriosis or malignancy.  - Bilateral Fallopian tubes      Unremarkable.      Negative for endometriosis or malignancy.   Assessment & Plan: Monica Black is a 41 y.o. woman with history of ER+ breast cancer, thickened endometrium with difficulty sampling pre-op and simple adnexal cyst now s/p RRBSO and  concurrent hysterectomy to rule out endometrial pathology.   Patient is overall doing very well and meeting postoperative milestones.  Discussed continued expectations as well as postoperative restrictions.  Patient was given a copy of her final pathology report, which we reviewed today together.  She is overall happy with benign findings.  Patient will be released back to her primary care provider and medical oncologist.  She is aware that she can call my office with any questions or needs that arise in the future.  22 minutes of total time was spent for this patient encounter, including preparation, face-to-face counseling with the patient and coordination of care, and documentation of the encounter.  Jeral Pinch, MD  Division of Gynecologic Oncology  Department of Obstetrics and Gynecology  [facility]

## 2022-03-26 ENCOUNTER — Inpatient Hospital Stay: Payer: Commercial Managed Care - PPO | Attending: Gynecologic Oncology | Admitting: Gynecologic Oncology

## 2022-03-26 ENCOUNTER — Encounter: Payer: Self-pay | Admitting: Gynecologic Oncology

## 2022-03-26 ENCOUNTER — Other Ambulatory Visit: Payer: Self-pay

## 2022-03-26 VITALS — BP 124/82 | HR 80 | Temp 98.4°F | Resp 18 | Ht 60.0 in | Wt 160.0 lb

## 2022-03-26 DIAGNOSIS — Z90722 Acquired absence of ovaries, bilateral: Secondary | ICD-10-CM

## 2022-03-26 DIAGNOSIS — Z853 Personal history of malignant neoplasm of breast: Secondary | ICD-10-CM

## 2022-03-26 DIAGNOSIS — Z90710 Acquired absence of both cervix and uterus: Secondary | ICD-10-CM

## 2022-03-26 DIAGNOSIS — N83209 Unspecified ovarian cyst, unspecified side: Secondary | ICD-10-CM

## 2022-03-26 DIAGNOSIS — R9389 Abnormal findings on diagnostic imaging of other specified body structures: Secondary | ICD-10-CM

## 2022-03-26 NOTE — Patient Instructions (Addendum)
It was good to see you today.  You are healing well from surgery.  Please remember, no heavy lifting for at least 6 weeks after surgery and nothing in the vagina for at least 8 weeks.  Please don't hesitate to call if you need anything.

## 2022-06-09 ENCOUNTER — Ambulatory Visit (INDEPENDENT_AMBULATORY_CARE_PROVIDER_SITE_OTHER): Payer: Commercial Managed Care - PPO | Admitting: Primary Care

## 2022-06-09 ENCOUNTER — Encounter: Payer: Self-pay | Admitting: Primary Care

## 2022-06-09 VITALS — BP 118/74 | HR 77 | Temp 97.9°F | Ht 60.0 in | Wt 165.0 lb

## 2022-06-09 DIAGNOSIS — Z0000 Encounter for general adult medical examination without abnormal findings: Secondary | ICD-10-CM | POA: Diagnosis not present

## 2022-06-09 DIAGNOSIS — Z853 Personal history of malignant neoplasm of breast: Secondary | ICD-10-CM | POA: Diagnosis not present

## 2022-06-09 NOTE — Progress Notes (Signed)
Subjective:    Patient ID: Monica Black, female    DOB: 1981/06/22, 41 y.o.   MRN: [id number]  HPI  Monica Black is a very pleasant 41 y.o. female who presents today for complete physical and follow up of chronic conditions.  Immunizations: -Tetanus: Over 10 years, declines today -Influenza: Does not complete -Covid-19: Never completed   Diet: Fair diet.  Exercise: No regular exercise.   Eye exam: Completes annually  Dental exam: Completes semi-annually   Pap Smear: Complete hysterectomy in May 2023 Mammogram: Double mastectomy    BP Readings from Last 3 Encounters:  06/09/22 118/74  03/26/22 124/82  03/12/22 121/77         Review of Systems  Constitutional:  Negative for unexpected weight change.  HENT:  Negative for rhinorrhea.   Respiratory:  Negative for cough and shortness of breath.   Cardiovascular:  Negative for chest pain.  Gastrointestinal:  Negative for constipation and diarrhea.  Genitourinary:  Negative for difficulty urinating.  Musculoskeletal:  Negative for arthralgias and myalgias.  Skin:  Negative for rash.  Allergic/Immunologic: Negative for environmental allergies.  Neurological:  Negative for dizziness and headaches.  Psychiatric/Behavioral:  The patient is not nervous/anxious.          Past Medical History:  Diagnosis Date   Anxiety    Arthritis    Asthma    mild no meds   Chemotherapy-induced neuropathy (HCC)    Chronic constipation    per pt intermittant   Complication of anesthesia    Headache    migraines   History of cancer chemotherapy    right breast  completed 09-15-2016  and again completed 06/ 2018 after mastectomies   History of external beam radiation therapy    right breast  01/ 2018  to 03/ 2018   Malignant neoplasm of overlapping sites of right breast in female, estrogen receptor positive ([place]) 2017   oncologist--- previously was dr Jana Hakim , new one is dr Mamie Nick. Chryl Heck;  05-18-2016  s/p right breast lumpectomy  w/ node dissection's; local advanced positive / 6 positive nodes  IDC,  Stage III,  ER/PR+,  completed chemo 11/ 2017;;  10-08-2016  s/p bilateral mastectomies w/ node dissection ,  residual IDC, left breast no maligancy,  completed chemo 06/ 2018 and radiation 03/ 2018   Pneumonia    PONV (postoperative nausea and vomiting)    Pre-diabetes    Thickened endometrium     Social History   Socioeconomic History   Marital status: Married    Spouse name: Not on file   Number of children: Not on file   Years of education: Not on file   Highest education level: Not on file  Occupational History   Not on file  Tobacco Use   Smoking status: Never   Smokeless tobacco: Never  Vaping Use   Vaping Use: Never used  Substance and Sexual Activity   Alcohol use: No   Drug use: Never   Sexual activity: Yes    Birth control/protection: None  Other Topics Concern   Not on file  Social History Narrative   Married.   No children.   Will be working with [employer] through the [place].   Enjoys shopping, movies, yoga.     Social Determinants of Health   Financial Resource Strain: Not on file  Food Insecurity: Not on file  Transportation Needs: Not on file  Physical Activity: Not on file  Stress: Not on file  Social  Connections: Not on file  Intimate Partner Violence: Not on file    Past Surgical History:  Procedure Laterality Date   BREAST LUMPECTOMY WITH AXILLARY LYMPH NODE DISSECTION Right 05/18/2016   DILATATION & CURETTAGE/HYSTEROSCOPY WITH MYOSURE N/A 01/26/2022   Procedure: DILATATION & CURETTAGE/HYSTEROSCOPY WITH MYOSURE;  Surgeon: [name], MD;  Location: [facility];  Service: Gynecology;  Laterality: N/A;   ELBOW HARDWARE REMOVAL Left 2013   MODIFIED RADICAL MASTECTOMY W/ AXILLARY LYMPH NODE DISSECTION Bilateral 10/08/2016   ORIF ELBOW FRACTURE Left 2012   ROBOTIC ASSISTED TOTAL HYSTERECTOMY WITH BILATERAL SALPINGO OOPHERECTOMY  Bilateral 03/02/2022   Procedure: XI ROBOTIC ASSISTED TOTAL HYSTERECTOMY WITH BILATERAL SALPINGO OOPHORECTOMY;  Surgeon: [name], MD;  Location: [facility];  Service: Gynecology;  Laterality: Bilateral;    Family History  Problem Relation Age of Onset   Other Mother        adopted, family history unk   Asthma Father    Brain cancer Paternal Grandfather    Parkinson's disease Paternal Uncle    Breast cancer Neg Hx    Ovarian cancer Neg Hx     Allergies  Allergen Reactions   Penicillins Hives   Tylenol [Acetaminophen] Itching   Latex Rash    Current Outpatient Medications on File Prior to Visit  Medication Sig Dispense Refill   cetirizine (ZYRTEC) 10 MG tablet Take 10 mg by mouth daily as needed for allergies.     Multiple Vitamin (MULTIVITAMIN) LIQD Take 30 mLs by mouth daily.     No current facility-administered medications on file prior to visit.    BP 118/74   Pulse 77   Temp 97.9 F (36.6 C) (Oral)   Ht 5' (1.524 m)   Wt 165 lb (74.8 kg)   SpO2 98%   BMI 32.22 kg/m  Objective:   Physical Exam HENT:     Right Ear: Tympanic membrane and ear canal normal.     Left Ear: Tympanic membrane and ear canal normal.     Nose: Nose normal.  Eyes:     Conjunctiva/sclera: Conjunctivae normal.     Pupils: Pupils are equal, round, and reactive to light.  Neck:     Thyroid: No thyromegaly.  Cardiovascular:     Rate and Rhythm: Normal rate and regular rhythm.     Heart sounds: No murmur heard. Pulmonary:     Effort: Pulmonary effort is normal.     Breath sounds: Normal breath sounds. No rales.  Abdominal:     General: Bowel sounds are normal.     Palpations: Abdomen is soft.     Tenderness: There is no abdominal tenderness.  Musculoskeletal:        General: Normal range of motion.     Cervical back: Neck supple.  Lymphadenopathy:     Cervical: No cervical adenopathy.  Skin:    General: Skin is warm and dry.     Findings: No rash.  Neurological:     Mental  Status: She is alert and oriented to person, place, and time.     Cranial Nerves: No cranial nerve deficit.     Deep Tendon Reflexes: Reflexes are normal and symmetric.  Psychiatric:        Mood and Affect: Mood normal.           Assessment & Plan:   Problem List Items Addressed This Visit       Other   Preventative health care - Primary    Declines Tetanus vaccine.  Hysterectomy and bilateral   Discussed the importance of a healthy diet and regular exercise in order for weight loss, and to reduce the risk of further co-morbidity.  Exam stable. Labs reviewed from May 2023.  Follow up in 1 year for repeat physical.       History of breast cancer    Following with oncology, history of bilateral mastectomy. No longer on Tamoxifen.   Reviewed oncology office notes from May 2023.  S/P total hysterectomy since May 2023. Reviewed GYN/ONC notes from June 2023.          Pleas Koch, NP

## 2022-06-09 NOTE — Assessment & Plan Note (Addendum)
Following with oncology, history of bilateral mastectomy. No longer on Tamoxifen.   Reviewed oncology office notes from May 2023.  S/P total hysterectomy since May 2023. Reviewed GYN/ONC notes from June 2023.

## 2022-06-09 NOTE — Patient Instructions (Signed)
It was a pleasure to see you today! ? ?Preventive Care 40-41 Years Old, Female ?Preventive care refers to lifestyle choices and visits with your health care provider that can promote health and wellness. Preventive care visits are also called wellness exams. ?What can I expect for my preventive care visit? ?Counseling ?Your health care provider may ask you questions about your: ?Medical history, including: ?Past medical problems. ?Family medical history. ?Pregnancy history. ?Current health, including: ?Menstrual cycle. ?Method of birth control. ?Emotional well-being. ?Home life and relationship well-being. ?Sexual activity and sexual health. ?Lifestyle, including: ?Alcohol, nicotine or tobacco, and drug use. ?Access to firearms. ?Diet, exercise, and sleep habits. ?Work and work environment. ?Sunscreen use. ?Safety issues such as seatbelt and bike helmet use. ?Physical exam ?Your health care provider will check your: ?Height and weight. These may be used to calculate your BMI (body mass index). BMI is a measurement that tells if you are at a healthy weight. ?Waist circumference. This measures the distance around your waistline. This measurement also tells if you are at a healthy weight and may help predict your risk of certain diseases, such as type 2 diabetes and high blood pressure. ?Heart rate and blood pressure. ?Body temperature. ?Skin for abnormal spots. ?What immunizations do I need? ? ?Vaccines are usually given at various ages, according to a schedule. Your health care provider will recommend vaccines for you based on your age, medical history, and lifestyle or other factors, such as travel or where you work. ?What tests do I need? ?Screening ?Your health care provider may recommend screening tests for certain conditions. This may include: ?Lipid and cholesterol levels. ?Diabetes screening. This is done by checking your blood sugar (glucose) after you have not eaten for a while (fasting). ?Pelvic exam and  Pap test. ?Hepatitis B test. ?Hepatitis C test. ?HIV (human immunodeficiency virus) test. ?STI (sexually transmitted infection) testing, if you are at risk. ?Lung cancer screening. ?Colorectal cancer screening. ?Mammogram. Talk with your health care provider about when you should start having regular mammograms. This may depend on whether you have a family history of breast cancer. ?BRCA-related cancer screening. This may be done if you have a family history of breast, ovarian, tubal, or peritoneal cancers. ?Bone density scan. This is done to screen for osteoporosis. ?Talk with your health care provider about your test results, treatment options, and if necessary, the need for more tests. ?Follow these instructions at home: ?Eating and drinking ? ?Eat a diet that includes fresh fruits and vegetables, whole grains, lean protein, and low-fat dairy products. ?Take vitamin and mineral supplements as recommended by your health care provider. ?Do not drink alcohol if: ?Your health care provider tells you not to drink. ?You are pregnant, may be pregnant, or are planning to become pregnant. ?If you drink alcohol: ?Limit how much you have to 0-1 drink a day. ?Know how much alcohol is in your drink. In the [place] one drink equals one 12 oz bottle of beer (355 mL), one 5 oz glass of wine (148 mL), or one 1? oz glass of hard liquor (44 mL). ?Lifestyle ?Brush your teeth every morning and night with fluoride toothpaste. Floss one time each day. ?Exercise for at least 30 minutes 5 or more days each week. ?Do not use any products that contain nicotine or tobacco. These products include cigarettes, chewing tobacco, and vaping devices, such as e-cigarettes. If you need help quitting, ask your health care provider. ?Do not use drugs. ?If you are sexually active, practice safe sex.   Use a condom or other form of protection to prevent STIs. If you do not wish to become pregnant, use a form of birth control. If you plan to become  pregnant, see your health care provider for a prepregnancy visit. Take aspirin only as told by your health care provider. Make sure that you understand how much to take and what form to take. Work with your health care provider to find out whether it is safe and beneficial for you to take aspirin daily. Find healthy ways to manage stress, such as: Meditation, yoga, or listening to music. Journaling. Talking to a trusted person. Spending time with friends and family. Minimize exposure to UV radiation to reduce your risk of skin cancer. Safety Always wear your seat belt while driving or riding in a vehicle. Do not drive: If you have been drinking alcohol. Do not ride with someone who has been drinking. When you are tired or distracted. While texting. If you have been using any mind-altering substances or drugs. Wear a helmet and other protective equipment during sports activities. If you have firearms in your house, make sure you follow all gun safety procedures. Seek help if you have been physically or sexually abused. What's next? Visit your health care provider once a year for an annual wellness visit. Ask your health care provider how often you should have your eyes and teeth checked. Stay up to date on all vaccines. This information is not intended to replace advice given to you by your health care provider. Make sure you discuss any questions you have with your health care provider. Document Revised: 04/08/2021 Document Reviewed: 04/08/2021 Elsevier Patient Education  [place].

## 2022-06-09 NOTE — Assessment & Plan Note (Signed)
Declines Tetanus vaccine.  Hysterectomy and bilateral   Discussed the importance of a healthy diet and regular exercise in order for weight loss, and to reduce the risk of further co-morbidity.  Exam stable. Labs reviewed from May 2023.  Follow up in 1 year for repeat physical.

## 2023-03-14 ENCOUNTER — Inpatient Hospital Stay: Payer: Commercial Managed Care - PPO

## 2023-03-14 ENCOUNTER — Encounter: Payer: Self-pay | Admitting: Hematology and Oncology

## 2023-03-14 ENCOUNTER — Inpatient Hospital Stay: Payer: Commercial Managed Care - PPO | Attending: Hematology and Oncology | Admitting: Hematology and Oncology

## 2023-03-14 VITALS — BP 126/64 | HR 74 | Temp 97.5°F | Resp 16 | Wt 160.4 lb

## 2023-03-14 DIAGNOSIS — Z9013 Acquired absence of bilateral breasts and nipples: Secondary | ICD-10-CM | POA: Insufficient documentation

## 2023-03-14 DIAGNOSIS — Z853 Personal history of malignant neoplasm of breast: Secondary | ICD-10-CM

## 2023-03-14 DIAGNOSIS — Z923 Personal history of irradiation: Secondary | ICD-10-CM | POA: Insufficient documentation

## 2023-03-14 DIAGNOSIS — R21 Rash and other nonspecific skin eruption: Secondary | ICD-10-CM | POA: Insufficient documentation

## 2023-03-14 DIAGNOSIS — Z9221 Personal history of antineoplastic chemotherapy: Secondary | ICD-10-CM | POA: Diagnosis not present

## 2023-03-14 LAB — CBC WITH DIFFERENTIAL/PLATELET
Abs Immature Granulocytes: 0.02 10*3/uL (ref 0.00–0.07)
Basophils Absolute: 0.1 10*3/uL (ref 0.0–0.1)
Basophils Relative: 1 %
Eosinophils Absolute: 0.1 10*3/uL (ref 0.0–0.5)
Eosinophils Relative: 2 %
HCT: 40.9 % (ref 36.0–46.0)
Hemoglobin: 14 g/dL (ref 12.0–15.0)
Immature Granulocytes: 0 %
Lymphocytes Relative: 42 %
Lymphs Abs: 2.8 10*3/uL (ref 0.7–4.0)
MCH: 29.4 pg (ref 26.0–34.0)
MCHC: 34.2 g/dL (ref 30.0–36.0)
MCV: 85.9 fL (ref 80.0–100.0)
Monocytes Absolute: 0.6 10*3/uL (ref 0.1–1.0)
Monocytes Relative: 8 %
Neutro Abs: 3.2 10*3/uL (ref 1.7–7.7)
Neutrophils Relative %: 47 %
Platelets: 335 10*3/uL (ref 150–400)
RBC: 4.76 MIL/uL (ref 3.87–5.11)
RDW: 12.1 % (ref 11.5–15.5)
WBC: 6.7 10*3/uL (ref 4.0–10.5)
nRBC: 0 % (ref 0.0–0.2)

## 2023-03-14 LAB — COMPREHENSIVE METABOLIC PANEL
ALT: 18 U/L (ref 0–44)
AST: 19 U/L (ref 15–41)
Albumin: 4.6 g/dL (ref 3.5–5.0)
Alkaline Phosphatase: 77 U/L (ref 38–126)
Anion gap: 8 (ref 5–15)
BUN: 14 mg/dL (ref 6–20)
CO2: 29 mmol/L (ref 22–32)
Calcium: 9.8 mg/dL (ref 8.9–10.3)
Chloride: 104 mmol/L (ref 98–111)
Creatinine, Ser: 0.94 mg/dL (ref 0.44–1.00)
GFR, Estimated: >60 mL/min (ref >60)
Glucose, Bld: 100 mg/dL — ABNORMAL HIGH (ref 70–99)
Potassium: 4 mmol/L (ref 3.5–5.1)
Sodium: 141 mmol/L (ref 135–145)
Total Bilirubin: 0.5 mg/dL (ref 0.3–1.2)
Total Protein: 7.5 g/dL (ref 6.5–8.1)

## 2023-03-14 NOTE — Progress Notes (Signed)
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   [name] arrived to the appointment today by herself.  She is doing well overall. She has some mild aches and pains here and there, but nothing progressive or persistent.  Most recently she has noticed some skin rash some red dots throughout her trunk as well as her arms and she wondered if this can be related to anything important.  She denies any major change in breathing, bowel habits or urinary habits.  No new neurological complaints.  No new bone pains.  No interim hospitalizations or infections.  She is very reluctant to consider extended antiestrogen therapy.    COVID 19 VACCINATION STATUS: Refuses vaccination   HISTORY OF CURRENT ILLNESS: From the original intake note:  Monica Black has a history of breast cancer dating back to 2017. She moved from [place] to [place] in 07/2017 and transferred her care to [facility] in [place], [place].   She had screening mammogram in June 2017 showing microcalcifications in the right breast. She proceeded to biopsy that month that showed by her recollection "stage 0" cancer. She tells me she had a PET scan at that time which did not show any adenopathy. Nevertheless when she proceeded to right lumpectomy with sentinel lymph node dissection on 05/18/2016, the pathology showed: invasive ductal carcinoma, 6-8 cm, grade 3;  lymphovascular invasion present; positive margins; 6 of 21 axillary lymph nodes positive for malignancy; estrogen receptor 100% positive, progesterone receptor 40% positive, Her2 negative (0).   She was subsequently treated with chemotherapy consisting of dose dense AC x4 cycles. She then received one dose of weekly taxol, but she had an infusion reaction. She was switched to taxotere and completed 3 cycles given 21 days apart on 09/15/2016.  She opted to undergo bilateral mastectomies with left sentinel lymph node biopsy on 10/08/2016 but decided against immediate DIEP reconstruction.. Final pathology [id number]) revealed right breast multifocal residual invasive ductal carcinoma, with 2 foci measuring 0.5 cm each, grade 3; lymphovascular invasion present; margins negative. Left breast and the single left sentinel lymph node were negative for malignancy.   She was then placed on tamoxifen, as well as Xeloda for 2 weeks on and 1 week off, in 10/2016. She received concurrent radiation therapy from 10/2016 through 12/27/2016. She completed 8 cycles of Xeloda on 04/16/2017.  Genetic testing was also performed while she was in [place], which showed no pathogenic mutations.  Although her periods never recurred, she was switched to Zoladex/letrozole on 10/11/2017. She is tolerating this well.  The patient's subsequent history is as detailed below.   PAST MEDICAL HISTORY: Past Medical History:  Diagnosis Date   Anxiety    Arthritis    Asthma    mild no meds   Chemotherapy-induced neuropathy (HCC)    Chronic constipation    per pt intermittant   Complication of anesthesia    Headache    migraines   History of cancer chemotherapy    right breast  completed 09-15-2016  and again  completed 06/ 2018 after mastectomies   History of external beam radiation therapy    right breast  01/ 2018  to 03/ 2018   Malignant neoplasm of overlapping sites of right breast in female, estrogen receptor positive  [facility]) 2017   oncologist--- previously was dr Darnelle Catalan , new one is dr Demetrius Charity. Al Pimple;  05-18-2016  s/p right breast lumpectomy w/ node dissection's; local advanced positive / 6 positive nodes  IDC,  Stage III,  ER/PR+,  completed chemo 11/ 2017;;  10-08-2016  s/p bilateral mastectomies w/ node dissection ,  residual IDC, left breast no maligancy,  completed chemo 06/ 2018 and radiation 03/ 2018   Pneumonia    PONV (postoperative nausea and vomiting)    Pre-diabetes    Thickened endometrium     PAST SURGICAL HISTORY: Past Surgical History:  Procedure Laterality Date   BREAST LUMPECTOMY WITH AXILLARY LYMPH NODE DISSECTION Right 05/18/2016   DILATATION & CURETTAGE/HYSTEROSCOPY WITH MYOSURE N/A 01/26/2022   Procedure: DILATATION & CURETTAGE/HYSTEROSCOPY WITH MYOSURE;  Surgeon: Carver Fila, MD;  Location: [facility];  Service: Gynecology;  Laterality: N/A;   ELBOW HARDWARE REMOVAL Left 2013   MODIFIED RADICAL MASTECTOMY W/ AXILLARY LYMPH NODE DISSECTION Bilateral 10/08/2016   ORIF ELBOW FRACTURE Left 2012   ROBOTIC ASSISTED TOTAL HYSTERECTOMY WITH BILATERAL SALPINGO OOPHERECTOMY Bilateral 03/02/2022   Procedure: XI ROBOTIC ASSISTED TOTAL HYSTERECTOMY WITH BILATERAL SALPINGO OOPHORECTOMY;  Surgeon: Carver Fila, MD;  Location: [facility];  Service: Gynecology;  Laterality: Bilateral;  Status post left elbow surgery   FAMILY HISTORY: Family History  Problem Relation Age of Onset   Other Mother        adopted, family history unk   Asthma Father    Brain cancer Paternal Grandfather    Parkinson's disease Paternal Uncle    Breast cancer Neg Hx    Ovarian cancer Neg Hx   The patient's mother was adopted and has no information regarding her biologic family. She is 42 years old as of December 2020. The patient's father is 54 years old as of December 2020. He has a history of asthma. His father had brain cancer. The patient's father had 1 brother, with Parkinson's disease, and 2  sisters, 1 of whom died at a young age. There is no history of breast or ovarian cancer on his side of the family. The patient himself has 2 brothers, aged 27 and 65 as of December 2020. She is not aware of any other cancer history in the family   GYNECOLOGIC HISTORY:  The patient thinks her last menstrual period was around October 2015. Her periods had been scant and irregular prior to that. From her marriage November 2015 through the start of chemo August 2017 (21 months) she and her husband used no contraceptives and she did not get pregnant. Menarche: 42 years old GX P 0 LMP 04/2016 Contraceptive used for 1 year HRT n/a  Hysterectomy? no BSO? no   SOCIAL HISTORY: (updated 09/2019)  Lesleyanne is currently working part-time at [employer]. Her husband Arlys John works for [employer] doing analysis of [employer] They attend [employer].    ADVANCED DIRECTIVES: In the absence of any documentation to the contrary, the patient's spouse is their HCPOA.   HEALTH MAINTENANCE: Social History   Tobacco Use   Smoking status: Never   Smokeless tobacco: Never  Vaping Use   Vaping Use: Never used  Substance Use Topics   Alcohol use: No   Drug use: Never  Colonoscopy: n/a  PAP: 04/2018, negative  Bone density: 2019?   Allergies  Allergen Reactions   Penicillins Hives   Tylenol [Acetaminophen] Itching   Latex Rash    Current Outpatient Medications  Medication Sig Dispense Refill   cetirizine (ZYRTEC) 10 MG tablet Take 10 mg by mouth daily as needed for allergies.     Multiple Vitamin (MULTIVITAMIN) LIQD Take 30 mLs by mouth daily.     No current facility-administered medications for this visit.    OBJECTIVE: white woman in no acute distress  Vitals:   03/14/23 1341  BP: 126/64  Pulse: 74  Resp: 16  Temp: (!) 97.5 F (36.4 C)  SpO2: 100%      Body mass index is 31.33 kg/m.   Wt Readings from Last 3 Encounters:  03/14/23 160 lb 6.4 oz (72.8 kg)   06/09/22 165 lb (74.8 kg)  03/26/22 160 lb (72.6 kg)      ECOG FS:1 - Symptomatic but completely ambulatory  Physical Exam Constitutional:      Appearance: Normal appearance.  Chest:     Comments: Bilateral mastectomies.  No concern for recurrence.  No regional adenopathy Musculoskeletal:        General: Normal range of motion.     Cervical back: Normal range of motion and neck supple. No rigidity.  Lymphadenopathy:     Cervical: No cervical adenopathy.  Neurological:     Mental Status: She is alert.       LAB RESULTS:  CMP     Component Value Date/Time   NA 140 02/24/2022 0844   K 4.2 02/24/2022 0844   CL 107 02/24/2022 0844   CO2 27 02/24/2022 0844   GLUCOSE 121 (H) 02/24/2022 0844   BUN 15 02/24/2022 0844   CREATININE 0.72 02/24/2022 0844   CREATININE 0.90 10/01/2019 1530   CALCIUM 9.3 02/24/2022 0844   PROT 7.6 02/24/2022 0844   ALBUMIN 4.3 02/24/2022 0844   AST 23 02/24/2022 0844   AST 20 10/01/2019 1530   ALT 21 02/24/2022 0844   ALT 20 10/01/2019 1530   ALKPHOS 44 02/24/2022 0844   BILITOT 0.7 02/24/2022 0844   BILITOT 0.4 10/01/2019 1530   GFRNONAA >60 02/24/2022 0844   GFRNONAA >60 10/01/2019 1530   GFRAA >60 03/26/2020 1321   GFRAA >60 10/01/2019 1530    No results found for: "TOTALPROTELP", "ALBUMINELP", "A1GS", "A2GS", "BETS", "BETA2SER", "GAMS", "MSPIKE", "SPEI"  No results found for: "KPAFRELGTCHN", "LAMBDASER", "KAPLAMBRATIO"  Lab Results  Component Value Date   WBC 5.5 02/24/2022   NEUTROABS 2.2 08/25/2021   HGB 13.3 02/24/2022   HCT 39.3 02/24/2022   MCV 90.3 02/24/2022   PLT 338 02/24/2022   No results found for: "LABCA2"  No components found for: "ZOXWRU045"  No results for input(s): "INR" in the last 168 hours.  No results found for: "LABCA2"  No results found for: "WUJ811"  No results found for: "CAN125"  No results found for: "CAN153"  No results found for: "CA2729"  No components found for: "HGQUANT"  No  results found for: "CEA1", "CEA" / No results found for: "CEA1", "CEA"   No results found for: "AFPTUMOR"  No results found for: "CHROMOGRNA"  No results found for: "HGBA", "HGBA2QUANT", "HGBFQUANT", "HGBSQUAN" (Hemoglobinopathy evaluation)   No results found for: "LDH"  No results found for: "IRON", "TIBC", "IRONPCTSAT" (Iron and TIBC)  No results found for: "FERRITIN"  Urinalysis    Component Value Date/Time   COLORURINE STRAW (A) 05/01/2018 2012   APPEARANCEUR  CLEAR (A) 05/01/2018 2012   LABSPEC 1.003 (L) 05/01/2018 2012   PHURINE 6.0 05/01/2018 2012   GLUCOSEU NEGATIVE 05/01/2018 2012   HGBUR SMALL (A) 05/01/2018 2012   BILIRUBINUR NEGATIVE 05/01/2018 2012   KETONESUR NEGATIVE 05/01/2018 2012   PROTEINUR NEGATIVE 05/01/2018 2012   NITRITE NEGATIVE 05/01/2018 2012   LEUKOCYTESUR SMALL (A) 05/01/2018 2012    STUDIES: No results found.    ELIGIBLE FOR AVAILABLE RESEARCH PROTOCOL: no  ASSESSMENT:   42 y.o. [place], [place] woman status post right breast overlapping sites lumpectomy and sentinel lymph node biopsy June 2017 (and subsequent axillary lymph node dissection 05/18/2016) showing a pT3 pN2, stage IIIA invasive ductal carcinoma, grade 3, estrogen and progesterone receptor positive, HER-2 not amplified.  (a) a total of 21 axillary lymph nodes removed, 6 of them positive  (1) [neo]adjuvant chemotherapy consisted of dose dense cyclophosphamide and doxorubicin x4 followed by paclitaxel weekly x1, with the paclitaxel discontinued because of an infusion reaction  (a) docetaxel x3 Q 21 days completed 09/15/2016.  (2) status post bilateral mastectomies and left sentinel lymph node sampling 10/08/2016 showing  (a) on the left no evidence of malignancy; single sentinel lymph node removed  (b) on the right, residual multifocal invasive ductal carcinoma, mpT1a, grade 3, with positive lymphovascular invasion but negative margins  (c) repeat prognostic panel: Estrogen receptor  100% positive, progesterone receptor 50% positive, HER-2 not amplified, MIB-1 was 20%  (3) adjuvant radiation given between January 2018 and 12/27/2016  (4) adjuvant capecitabine (2 weeks on, 1 week off) started January 2018 and completed 8 cycles 04/16/2017  (5) tamoxifen started January 2018  (a) changed to letrozole/goserelin January 2019  (b) baseline DEXA scan May 2019 normal  (c) letrozole/goserelin discontinued 01/01/2020 at patient's preference  (6) negative genetics testing [[place] 2017]  through [facility] myRisk gene panel  (7) considering bilateral salpingo-oophorectomy  (8) considering denosumab or zoledronate  (a) restaging studies at [facility] 03/22/2019 included brain MRI, bone scan and CT scans of the chest abdomen and pelvis showed no evidence of metastatic disease but there was a new right anterior fifth rib fracture and 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  (9) tamoxifen resumed 01/01/2020, completed 5 yrs of anti estrogen therapy   PLAN:  Shanyra is coming up on 7 years from definitive surgery for her breast cancer with no evidence of disease recurrence.   She feels well today, no persistent or progressive pains except for skin rash that she is worried about.  She is also scheduled to see dermatology soon.  Will do a CBC and CMP today to rule out a possible petechial rash.  Otherwise bilateral breast exam status post mastectomy, no concern for breast cancer recurrence there.  No other review of systems or physical examination findings to consider systemic recurrence. She wants to continue follow-up with [place] annually at this time.  This is certainly reasonable. I have encouraged her to call us sooner with any new questions or concerns.  Otherwise I will plan to see her in 1 year.  Total time spent: 30 minutes *Total Encounter Time as defined by the Centers for Medicare and Medicaid Services includes, in addition to the  face-to-face time of a patient visit (documented in the note above) non-face-to-face time: obtaining and reviewing outside history, ordering and reviewing medications, tests or procedures, care coordination (communications with other health care professionals or caregivers) and documentation in the medical record.

## 2023-05-25 ENCOUNTER — Encounter (INDEPENDENT_AMBULATORY_CARE_PROVIDER_SITE_OTHER): Payer: Self-pay

## 2023-06-14 ENCOUNTER — Encounter: Payer: Self-pay | Admitting: Primary Care

## 2023-06-14 ENCOUNTER — Ambulatory Visit (INDEPENDENT_AMBULATORY_CARE_PROVIDER_SITE_OTHER): Payer: Commercial Managed Care - PPO | Admitting: Primary Care

## 2023-06-14 VITALS — BP 120/62 | HR 78 | Temp 98.2°F | Ht 60.0 in | Wt 155.0 lb

## 2023-06-14 DIAGNOSIS — Z0000 Encounter for general adult medical examination without abnormal findings: Secondary | ICD-10-CM | POA: Diagnosis not present

## 2023-06-14 DIAGNOSIS — Z853 Personal history of malignant neoplasm of breast: Secondary | ICD-10-CM

## 2023-06-14 NOTE — Assessment & Plan Note (Addendum)
Declines tetanus.  Double mastectomy and complete hysterectomy.  Discussed the importance of a healthy diet and regular exercise in order for weight loss, and to reduce the risk of further co-morbidity.  Exam stable. Labs reviewed.  Follow up in 1 year for repeat physical.

## 2023-06-14 NOTE — Patient Instructions (Signed)
It was a pleasure to see you today!   

## 2023-06-14 NOTE — Assessment & Plan Note (Signed)
Following with oncology, reviewed office notes from May 2024. Proceed with annual visits.

## 2023-06-14 NOTE — Progress Notes (Signed)
Subjective:    Patient ID: Monica Black, female    DOB: 17-Aug-1981, 42 y.o.   MRN: [id number]  HPI  Monica Black is a very pleasant 42 y.o. female who presents today for complete physical and follow up of chronic conditions.  Immunizations: -Tetanus: Completed > 10 years ago.   Diet: Fair diet.  Exercise: No regular exercise.  Eye exam: Completes annually  Dental exam: Completes semi-annually    Pap Smear: Complete hysterectomy  Mammogram: Double mastectomy   BP Readings from Last 3 Encounters:  06/14/23 120/62  03/14/23 126/64  06/09/22 118/74        Review of Systems  Constitutional:  Negative for unexpected weight change.  HENT:  Negative for rhinorrhea.   Respiratory:  Negative for cough and shortness of breath.   Cardiovascular:  Negative for chest pain.  Gastrointestinal:  Negative for constipation and diarrhea.  Genitourinary:  Negative for difficulty urinating.  Musculoskeletal:  Negative for arthralgias and myalgias.  Skin:  Negative for rash.  Allergic/Immunologic: Negative for environmental allergies.  Neurological:  Positive for headaches. Negative for dizziness and numbness.  Psychiatric/Behavioral:  The patient is not nervous/anxious.          Past Medical History:  Diagnosis Date   Anxiety    Arthritis    Asthma    mild no meds   Chemotherapy-induced neuropathy (HCC)    Chronic constipation    per pt intermittant   Complication of anesthesia    Headache    migraines   History of cancer chemotherapy    right breast  completed 09-15-2016  and again completed 06/ 2018 after mastectomies   History of external beam radiation therapy    right breast  01/ 2018  to 03/ 2018   Malignant neoplasm of overlapping sites of right breast in female, estrogen receptor positive (HCC) 2017   oncologist--- previously was dr Darnelle Catalan , new one is dr Demetrius Charity. Al Pimple;  05-18-2016  s/p right breast lumpectomy w/ node dissection's; local advanced positive / 6  positive nodes  IDC,  Stage III,  ER/PR+,  completed chemo 11/ 2017;;  10-08-2016  s/p bilateral mastectomies w/ node dissection ,  residual IDC, left breast no maligancy,  completed chemo 06/ 2018 and radiation 03/ 2018   Pneumonia    PONV (postoperative nausea and vomiting)    Pre-diabetes    Thickened endometrium     Social History   Socioeconomic History   Marital status: Married    Spouse name: Not on file   Number of children: Not on file   Years of education: Not on file   Highest education level: Not on file  Occupational History   Not on file  Tobacco Use   Smoking status: Never   Smokeless tobacco: Never  Vaping Use   Vaping status: Never Used  Substance and Sexual Activity   Alcohol use: No   Drug use: Never   Sexual activity: Yes    Birth control/protection: None  Other Topics Concern   Not on file  Social History Narrative   Married.   No children.   Will be working with [employer] and [employer] through the [place] [employer] [place].   Enjoys shopping, movies, yoga.     Social Determinants of Health   Financial Resource Strain: Not on file  Food Insecurity: Not on file  Transportation Needs: Not on file  Physical Activity: Not on file  Stress: Not on file  Social Connections: Not on file  Intimate Partner Violence:  Not on file    Past Surgical History:  Procedure Laterality Date   BREAST LUMPECTOMY WITH AXILLARY LYMPH NODE DISSECTION Right 05/18/2016   DILATATION & CURETTAGE/HYSTEROSCOPY WITH MYOSURE N/A 01/26/2022   Procedure: DILATATION & CURETTAGE/HYSTEROSCOPY WITH MYOSURE;  Surgeon: Carver Fila, MD;  Location: [facility];  Service: Gynecology;  Laterality: N/A;   ELBOW HARDWARE REMOVAL Left 2013   MODIFIED RADICAL MASTECTOMY W/ AXILLARY LYMPH NODE DISSECTION Bilateral 10/08/2016   ORIF ELBOW FRACTURE Left 2012   ROBOTIC ASSISTED TOTAL HYSTERECTOMY WITH BILATERAL SALPINGO OOPHERECTOMY Bilateral 03/02/2022   Procedure: XI ROBOTIC ASSISTED  TOTAL HYSTERECTOMY WITH BILATERAL SALPINGO OOPHORECTOMY;  Surgeon: Carver Fila, MD;  Location: [facility];  Service: Gynecology;  Laterality: Bilateral;    Family History  Problem Relation Age of Onset   Other Mother        adopted, family history unk   Asthma Father    Brain cancer Paternal Grandfather    Parkinson's disease Paternal Uncle    Breast cancer Neg Hx    Ovarian cancer Neg Hx     Allergies  Allergen Reactions   Penicillins Hives   Tylenol [Acetaminophen] Itching   Latex Rash    Current Outpatient Medications on File Prior to Visit  Medication Sig Dispense Refill   cetirizine (ZYRTEC) 10 MG tablet Take 10 mg by mouth daily as needed for allergies.     Multiple Vitamin (MULTIVITAMIN) LIQD Take 30 mLs by mouth daily.     No current facility-administered medications on file prior to visit.    BP 120/62   Pulse 78   Temp 98.2 F (36.8 C) (Temporal)   Ht 5' (1.524 m)   Wt 155 lb (70.3 kg)   SpO2 97%   BMI 30.27 kg/m  Objective:   Physical Exam HENT:     Right Ear: Tympanic membrane and ear canal normal.     Left Ear: Tympanic membrane and ear canal normal.     Nose: Nose normal.  Eyes:     Conjunctiva/sclera: Conjunctivae normal.     Pupils: Pupils are equal, round, and reactive to light.  Neck:     Thyroid: No thyromegaly.  Cardiovascular:     Rate and Rhythm: Normal rate and regular rhythm.     Heart sounds: No murmur heard. Pulmonary:     Effort: Pulmonary effort is normal.     Breath sounds: Normal breath sounds. No rales.  Abdominal:     General: Bowel sounds are normal.     Palpations: Abdomen is soft.     Tenderness: There is no abdominal tenderness.  Musculoskeletal:        General: Normal range of motion.     Cervical back: Neck supple.  Lymphadenopathy:     Cervical: No cervical adenopathy.  Skin:    General: Skin is warm and dry.     Findings: No rash.  Neurological:     Mental Status: She is alert and oriented to person,  place, and time.     Cranial Nerves: No cranial nerve deficit.     Deep Tendon Reflexes: Reflexes are normal and symmetric.  Psychiatric:        Mood and Affect: Mood normal.           Assessment & Plan:  Preventative health care Assessment & Plan: Declines tetanus.  Double mastectomy and complete hysterectomy.  Discussed the importance of a healthy diet and regular exercise in order for weight loss, and to reduce the risk  of further co-morbidity.  Exam stable. Labs reviewed.  Follow up in 1 year for repeat physical.    History of breast cancer Assessment & Plan: Following with oncology, reviewed office notes from May 2024. Proceed with annual visits.            Doreene Nest, NP

## 2023-11-11 IMAGING — US US PELVIS COMPLETE WITH TRANSVAGINAL
1 series · 13 of 25 positions shown · non-contrast
Comparison: 01/02/2021

Correlation: MR lumbar spine 11/24/2021

CLINICAL DATA: Abnormal lumbar MRI with a uterine cyst, N 85.8,
postmenopausal with LMP in 0211



[Series 1: us pelvis complete with transvaginal · 0.22mm/px · 13 of 95 slices shown]
[im 1/95]
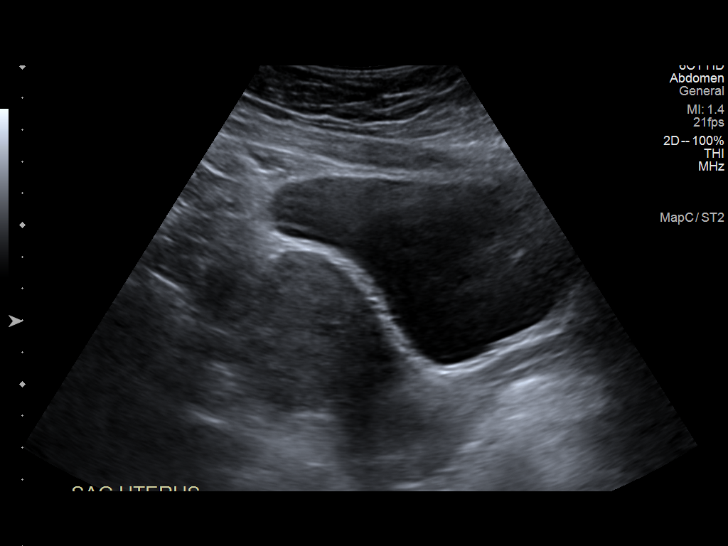
[im 8/95]
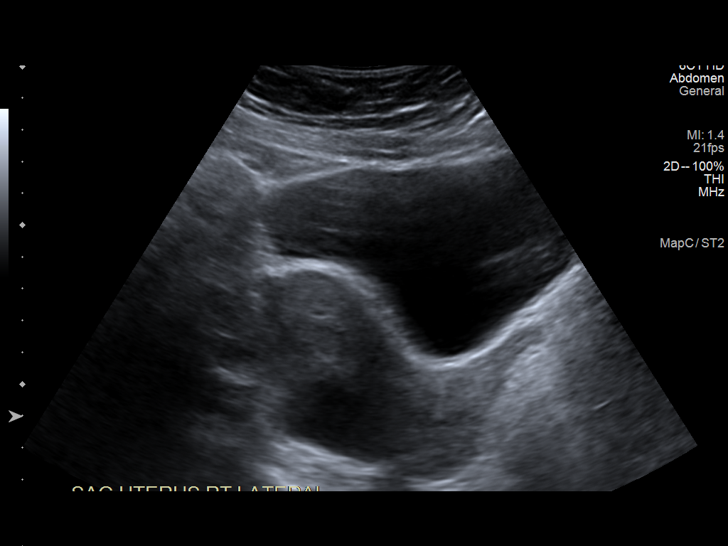
[im 16/95]
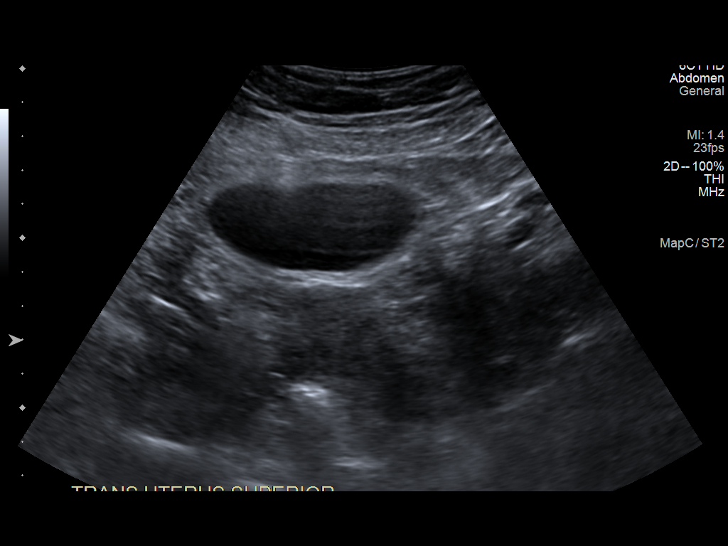
[im 24/95]
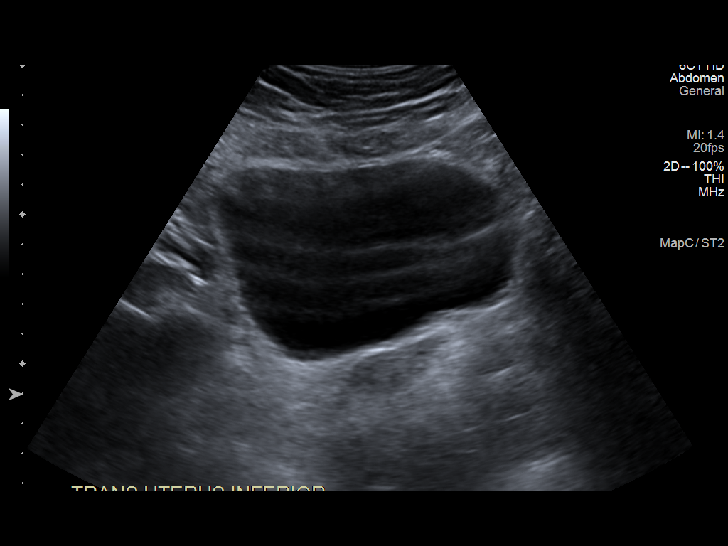
[im 32/95]
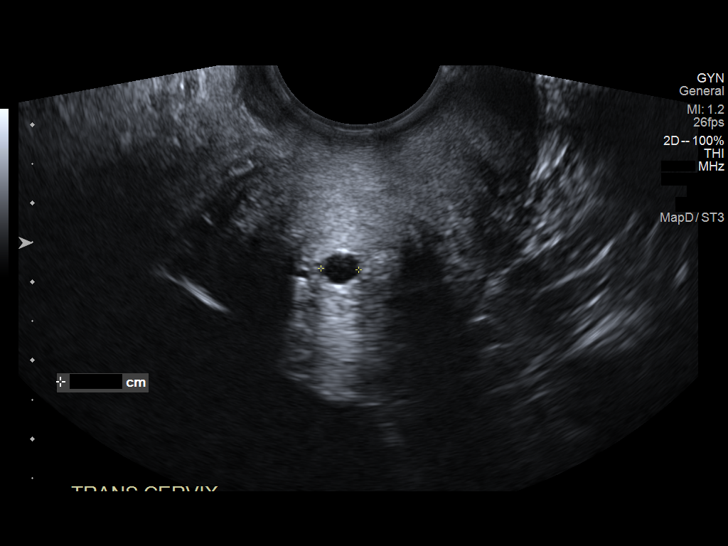
[im 40/95]
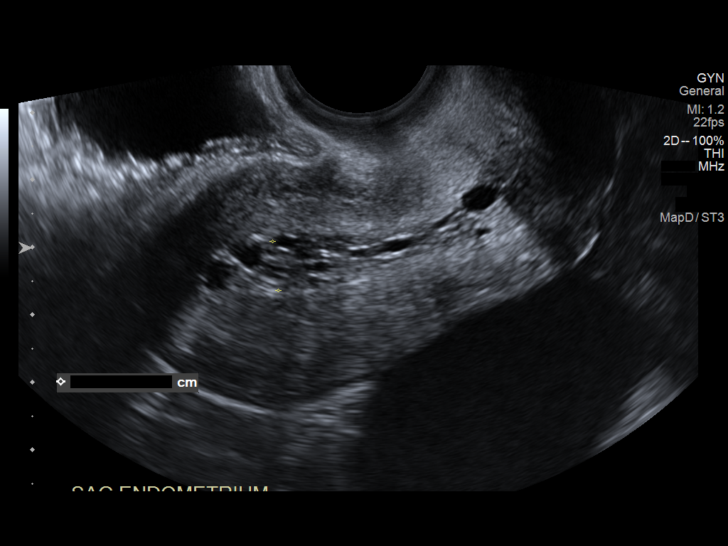
[im 48/95]
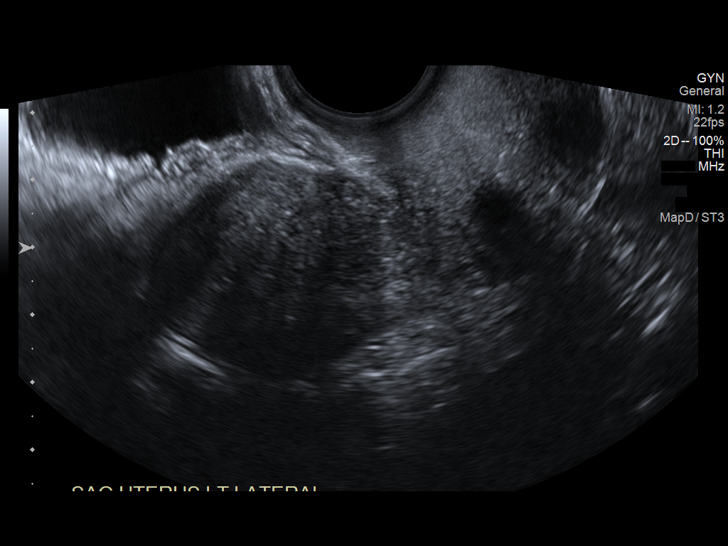
[im 55/95]
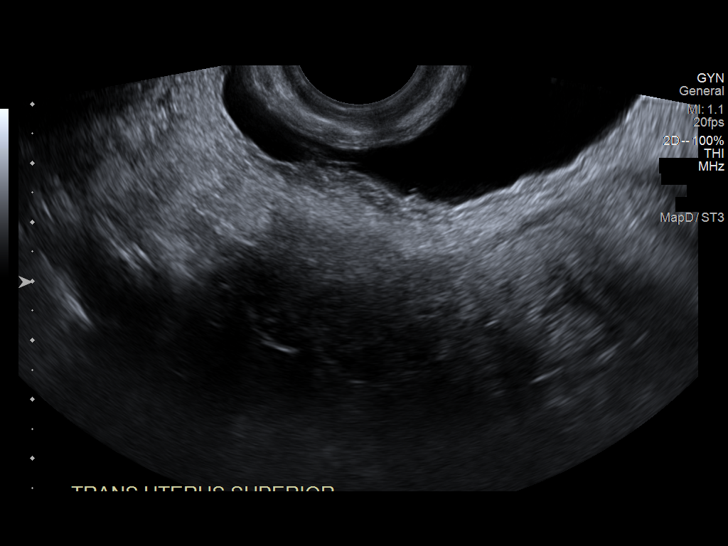
[im 63/95]
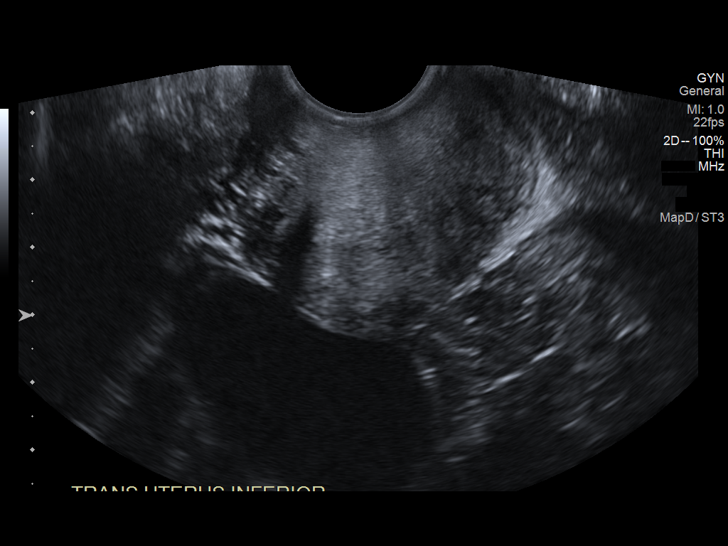
[im 71/95]
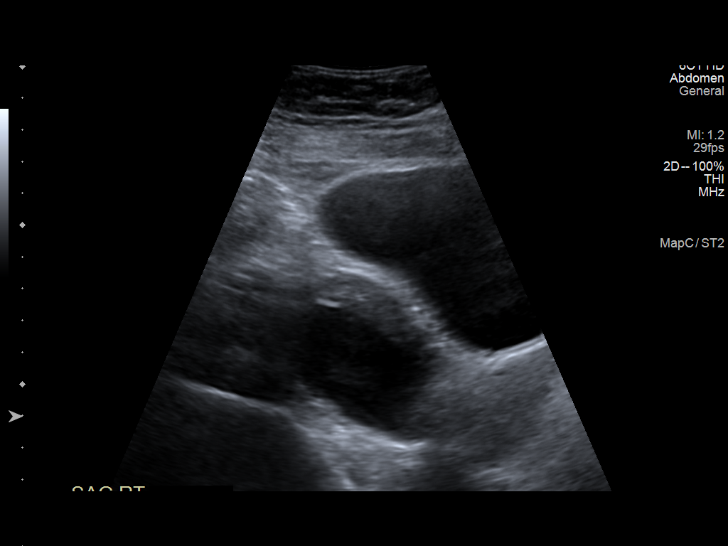
[im 79/95]
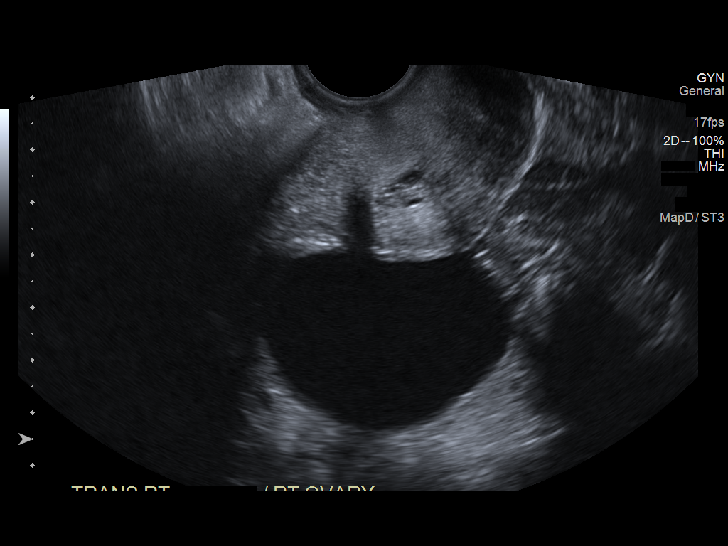
[im 87/95]
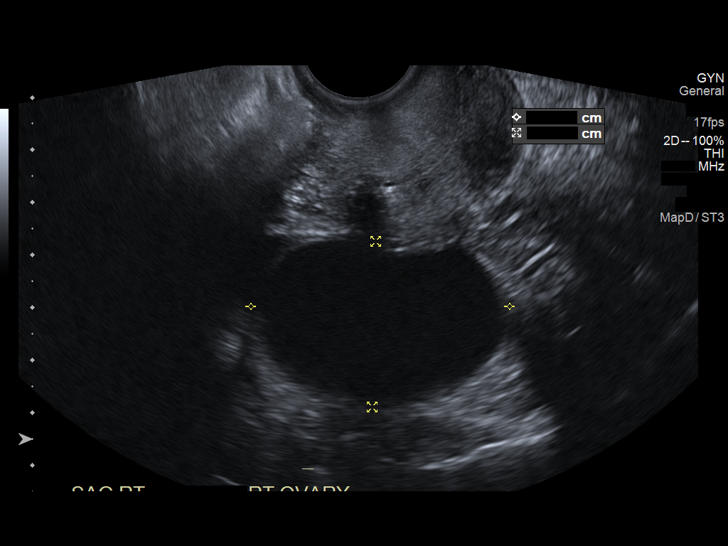
[im 95/95]
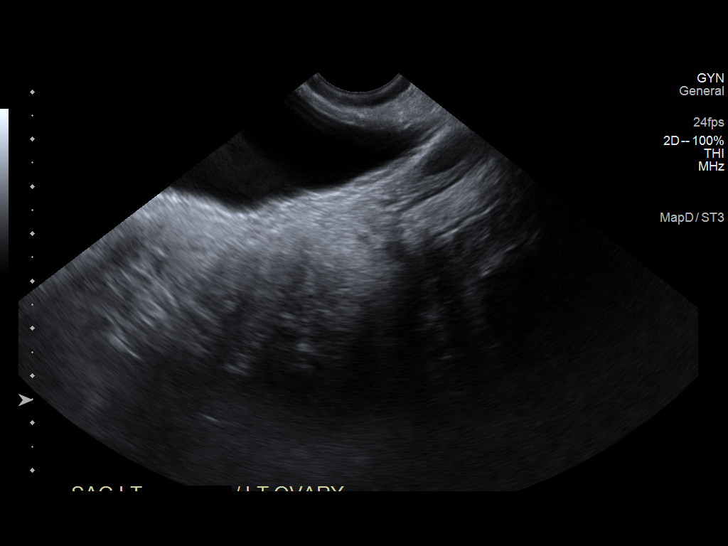

[13 of 25 positions shown; findings below may reference images not displayed]

FINDINGS: Uterus

Measurements: 8.3 x 4.4 x 4.7 cm = volume: 91 mL. Anteverted.
Heterogeneous myometrium. No discrete uterine mass. Small nabothian
cyst at cervix.

Endometrium

Thickness: 11 mm. Heterogeneous echogenicity with cystic foci and
small amount of fluid. No dominant mass identified

Right ovary

No normal appearing RIGHT ovary visualized, see below

Left ovary

Not visualized, likely obscured by bowel

Other findings

Trace free pelvic fluid. Simple appearing cyst identified within
RIGHT adnexa, may be of ovarian or paraovarian origin, 4.9 x 3.2 x
3.9 cm. No mural nodularity or definite septations. No definite
complicating features.
IMPRESSION: Nonvisualization of ovaries.

4.9 cm diameter simple appearing cyst within RIGHT adnexa, may be of
ovarian or paraovarian origin; recommend followup US in 3-6 months.
Note: This recommendation does not apply to premenarchal patients or
to those with increased risk (genetic, family history, elevated
tumor markers or other high-risk factors) of ovarian cancer.
Reference: Radiology [DATE]):[id number].

Heterogeneous endometrial complex 11 mm thick containing cystic foci
and small amount of endometrial fluid.

Endometrial thickness is considered abnormal for an asymptomatic
post-menopausal female. Endometrial sampling recommended to exclude
carcinoma.

These results will be called to the ordering clinician or
representative by the Radiologist Assistant, and communication
documented in the PACS or [REDACTED].

## 2024-03-12 NOTE — Progress Notes (Signed)
 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  History of Present Illness Monica Black is a 43 year old female with breast cancer who presents for follow-up regarding ongoing symptoms post-treatment.  She experiences persistent nerve pain, attributed to nerve damage from previous chemotherapy. The pain is exacerbated by changes in weather, particularly with pressure changes and increased humidity, and is widespread, including her extremities and fingertips. She also experiences joint aches and swelling of her feet, which she correlates with weather changes.  She has not experienced any recurrence of symptoms that led to her initial breast cancer diagnosis, such as bloating or constant headaches. She remains vigilant for these symptoms as a precaution.  She has not consulted any specialists besides her primary care provider for routine physicals.She decided not to undergo breast reconstruction post-mastectomy.  She has noticed an increase in cherry angiomas over time, which her father also has, suggesting a possible familial tendency.  She maintains a predominantly organic diet, limiting processed foods and focusing on organic meats and dairy. She emphasizes the importance of mental health and happiness, noting that spending time with her adopted daughter brings her joy.   COVID 19  VACCINATION STATUS: Refuses vaccination   HISTORY OF CURRENT ILLNESS: From the original intake note:  Monica Black has a history of breast cancer dating back to 2017. She moved from [place]  to [place] in 07/2017 and transferred her care to [facility] in [place], [place].   She had screening mammogram in June 2017 showing microcalcifications in the right breast. She proceeded to biopsy that month that showed by her recollection "stage 0" cancer. She tells me she had a PET scan at that time which did not show any adenopathy. 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   She was subsequently treated with chemotherapy consisting of dose dense AC x4 cycles. She then received one dose of weekly taxol, but she had an infusion reaction. She was switched to taxotere and completed 3 cycles given 21 days apart on 09/15/2016.  She opted to undergo bilateral mastectomies with left sentinel lymph node biopsy on 10/08/2016 but decided against immediate DIEP reconstruction.. Final pathology [id number]) revealed right breast multifocal residual invasive ductal carcinoma, with 2 foci measuring 0.5 cm each, grade 3; lymphovascular invasion present; margins negative. Left breast and the single left sentinel lymph node were negative for malignancy.   She was then placed on tamoxifen , as well as Xeloda for 2 weeks on and 1 week off, in 10/2016. She received concurrent radiation therapy from 10/2016 through 12/27/2016. She completed 8 cycles of Xeloda on 04/16/2017.  Genetic testing was also performed while she was in [place] , which showed no pathogenic mutations.  Although her periods never recurred, she was switched to  Zoladex /letrozole  on 10/11/2017. She is tolerating this well.  The patient's subsequent history is as detailed below.   PAST MEDICAL HISTORY: Past Medical History:  Diagnosis Date   Anxiety    Arthritis    Asthma    mild no meds   Chemotherapy-induced neuropathy (HCC)    Chronic constipation    per pt intermittant   Complication of anesthesia    Headache    migraines   History of cancer chemotherapy    right breast  completed 09-15-2016  and again completed 06/ 2018 after mastectomies   History of external beam radiation therapy    right breast  01/ 2018  to 03/ 2018   Malignant neoplasm of overlapping sites of right breast in female, estrogen receptor positive (HCC) 2017   oncologist--- previously was dr Charolett Copes , new one is dr Arlester Ladd. [facility];  05-18-2016  s/p right breast lumpectomy w/ node dissection's; local advanced positive / 6 positive nodes  IDC,  Stage III,  ER/PR+,  completed chemo 11/ 2017;;  10-08-2016  s/p bilateral mastectomies w/ node dissection ,  residual IDC, left breast no maligancy,  completed chemo 06/ 2018 and radiation 03/ 2018   Pneumonia    PONV (postoperative nausea and vomiting)    Pre-diabetes    Thickened endometrium     PAST SURGICAL HISTORY: Past Surgical History:  Procedure Laterality Date   BREAST LUMPECTOMY WITH AXILLARY LYMPH NODE DISSECTION Right 05/18/2016   DILATATION & CURETTAGE/HYSTEROSCOPY WITH MYOSURE N/A 01/26/2022   Procedure: DILATATION & CURETTAGE/HYSTEROSCOPY WITH MYOSURE;  Surgeon: Suzi Essex, MD;  Location: [facility];  Service: Gynecology;  Laterality: N/A;   ELBOW HARDWARE REMOVAL Left 2013   MODIFIED RADICAL MASTECTOMY W/ AXILLARY LYMPH NODE DISSECTION Bilateral 10/08/2016   ORIF ELBOW FRACTURE Left 2012   ROBOTIC ASSISTED TOTAL HYSTERECTOMY WITH BILATERAL SALPINGO OOPHERECTOMY Bilateral 03/02/2022   Procedure: XI ROBOTIC ASSISTED TOTAL HYSTERECTOMY WITH BILATERAL SALPINGO OOPHORECTOMY;  Surgeon: Suzi Essex, MD;  Location: [facility];  Service: Gynecology;  Laterality: Bilateral;  Status post left elbow surgery   FAMILY HISTORY: Family History  Problem Relation Age of Onset   Other Mother        adopted, family history unk   Asthma Father    Brain cancer Paternal Grandfather    Parkinson's disease Paternal Uncle    Breast cancer Neg Hx    Ovarian cancer Neg Hx   The patient's mother was adopted and has no information regarding her biologic family. She is 43 years old as of December 2020. The patient's father is 70 years old as of December 2020. He has a history of asthma. His father had brain cancer. The patient's father had 1 brother, with Parkinson's disease, and 2 sisters, 1 of whom died at a young age. There is no history of breast or ovarian cancer on his side of the family. The patient himself has 2 brothers, aged 34 and 32 as of December 2020. She is not aware of any other cancer history in the family   GYNECOLOGIC HISTORY:  The patient thinks her last menstrual period was around October 2015. Her periods had been scant and irregular prior to that. From her marriage November 2015 through the start of chemo August 2017 (21 months) she and her husband used no contraceptives and she did not get pregnant. Menarche: 43 years old GX P 0 LMP 04/2016 Contraceptive used for 1 year HRT n/a  Hysterectomy? no BSO? no   SOCIAL HISTORY: (updated  09/2019)  Reeshemah is currently working part-time at [employer]. Her husband Polly Brink works for [employer] doing analysis of [employer]. They attend [employer].    ADVANCED DIRECTIVES: In the absence of any documentation to the contrary, the patient's spouse is their HCPOA.   HEALTH MAINTENANCE: Social History   Tobacco Use   Smoking status: Never   Smokeless tobacco: Never  Vaping Use   Vaping status: Never Used  Substance Use Topics   Alcohol use: No   Drug use: Never     Colonoscopy: n/a  PAP: 04/2018,  negative  Bone density: 2019?   Allergies  Allergen Reactions   Penicillins Hives   Tylenol  [Acetaminophen ] Itching   Latex Rash    Current Outpatient Medications  Medication Sig Dispense Refill   cetirizine  (ZYRTEC ) 10 MG tablet Take 10 mg by mouth daily as needed for allergies.     Multiple Vitamin (MULTIVITAMIN) LIQD Take 30 mLs by mouth daily.     No current facility-administered medications for this visit.    OBJECTIVE: white woman in no acute distress  There were no vitals filed for this visit.     There is no height or weight on file to calculate BMI.   Wt Readings from Last 3 Encounters:  06/14/23 155 lb (70.3 kg)  03/14/23 160 lb 6.4 oz (72.8 kg)  06/09/22 165 lb (74.8 kg)      ECOG FS:1 - Symptomatic but completely ambulatory  Physical Exam Constitutional:      Appearance: Normal appearance.  Chest:     Comments: Bilateral mastectomies.  No concern for recurrence.  No regional adenopathy Musculoskeletal:        General: Normal range of motion.     Cervical back: Normal range of motion and neck supple. No rigidity.  Lymphadenopathy:     Cervical: No cervical adenopathy.  Neurological:     Mental Status: She is alert.      LAB RESULTS:  CMP     Component Value Date/Time   NA 141 03/14/2023 1416   K 4.0 03/14/2023 1416   CL 104 03/14/2023 1416   CO2 29 03/14/2023 1416   GLUCOSE 100 (H) 03/14/2023 1416   BUN 14 03/14/2023 1416   CREATININE 0.94 03/14/2023 1416   CREATININE 0.90 10/01/2019 1530   CALCIUM 9.8 03/14/2023 1416   PROT 7.5 03/14/2023 1416   ALBUMIN 4.6 03/14/2023 1416   AST 19 03/14/2023 1416   AST 20 10/01/2019 1530   ALT 18 03/14/2023 1416   ALT 20 10/01/2019 1530   ALKPHOS 77 03/14/2023 1416   BILITOT 0.5 03/14/2023 1416   BILITOT 0.4 10/01/2019 1530   GFRNONAA >60 03/14/2023 1416   GFRNONAA >60 10/01/2019 1530   GFRAA >60 03/26/2020 1321   GFRAA >60 10/01/2019 1530    No results found for: "TOTALPROTELP", "ALBUMINELP",  "A1GS", "A2GS", "BETS", "BETA2SER", "GAMS", "MSPIKE", "SPEI"  No results found for: "KPAFRELGTCHN", "LAMBDASER", "KAPLAMBRATIO"  Lab Results  Component Value Date   WBC 6.7 03/14/2023   NEUTROABS 3.2 03/14/2023   HGB 14.0 03/14/2023   HCT 40.9 03/14/2023   MCV 85.9 03/14/2023   PLT 335 03/14/2023   No results found for: "LABCA2"  No components found for: "ZOXWRU045"  No results for input(s): "INR" in the last 168 hours.  No results found for: "LABCA2"  No results found for: "WUJ811"  No results found for: "CAN125"  No results found for: "CAN153"  No results found for: "CA2729"  No components found for: "HGQUANT"  No results found for: "CEA1", "CEA" / No results found for: "CEA1", "CEA"   No results found for: "AFPTUMOR"  No results found for: "CHROMOGRNA"  No results found for: "HGBA", "HGBA2QUANT", "HGBFQUANT", "HGBSQUAN" (Hemoglobinopathy evaluation)   No results found for: "LDH"  No results found for: "IRON", "TIBC", "IRONPCTSAT" (Iron and TIBC)  No results found for: "FERRITIN"  Urinalysis    Component Value Date/Time   COLORURINE STRAW (A) 05/01/2018 2012   APPEARANCEUR CLEAR (A) 05/01/2018 2012   LABSPEC 1.003 (L) 05/01/2018 2012   PHURINE 6.0 05/01/2018 2012   GLUCOSEU NEGATIVE 05/01/2018 2012   HGBUR SMALL (A) 05/01/2018 2012   BILIRUBINUR NEGATIVE 05/01/2018 2012   KETONESUR NEGATIVE 05/01/2018 2012   PROTEINUR NEGATIVE 05/01/2018 2012   NITRITE NEGATIVE 05/01/2018 2012   LEUKOCYTESUR SMALL (A) 05/01/2018 2012    STUDIES: No results found.    ELIGIBLE FOR AVAILABLE RESEARCH PROTOCOL: no  ASSESSMENT:   43 y.o. [place], [place] woman status post right breast overlapping sites lumpectomy and sentinel lymph node biopsy June 2017 (and subsequent axillary lymph node dissection 05/18/2016) showing a pT3 pN2, stage IIIA invasive ductal carcinoma, grade 3, estrogen and progesterone receptor positive, HER-2 not amplified.  (a) a total of 21 axillary  lymph nodes removed, 6 of them positive  (1) [neo]adjuvant chemotherapy consisted of dose dense cyclophosphamide and doxorubicin x4 followed by paclitaxel weekly x1, with the paclitaxel discontinued because of an infusion reaction  (a) docetaxel x3 Q 21 days completed 09/15/2016.  (2) status post bilateral mastectomies and left sentinel lymph node sampling 10/08/2016 showing  (a) on the left no evidence of malignancy; single sentinel lymph node removed  (b) on the right, residual multifocal invasive ductal carcinoma, mpT1a, grade 3, with positive lymphovascular invasion but negative margins  (c) repeat prognostic panel: Estrogen receptor 100% positive, progesterone receptor 50% positive, HER-2 not amplified, MIB-1 was 20%  (3) adjuvant radiation given between January 2018 and 12/27/2016  (4) adjuvant capecitabine (2 weeks on, 1 week off) started January 2018 and completed 8 cycles 04/16/2017  (5) tamoxifen  started January 2018  (a) changed to letrozole /goserelin January 2019  (b) baseline DEXA scan May 2019 normal  (c) letrozole /goserelin discontinued 01/01/2020 at patient's preference  (6) negative genetics testing [[place] 2017]  through [facility] myRisk gene panel  (7) considering bilateral salpingo-oophorectomy  (8) considering denosumab or zoledronate  (a) restaging studies at [facility] 03/22/2019 included brain MRI, bone scan and CT scans of the chest abdomen and pelvis showed no evidence of metastatic disease but there was a new right anterior fifth rib fracture and 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  (9) tamoxifen  resumed 01/01/2020, completed 5 yrs of anti estrogen therapy   PLAN:  Assessment and Plan Assessment & Plan Breast cancer in remission In remission for eight years with no symptoms of recurrence. Discussed optional Guardant Reveal blood test for tumor DNA detection. Explained test is not FDA approved, Informed  consent provided about test's optional nature and that detectable tumor DNA does not always indicate recurrence. - Provide information on Guardant Reveal test for cancer recurrence detection. - She was encouraged to call if she is interested.  Peripheral neuropathy Chronic neuropathy likely due to chemotherapy. Symptoms include nerve pain affected by weather changes, impacting extremities and fingertips. No medication needed at this time.  She will continue annual follow up.  Total time spent: 30 minutes *Total Encounter Time as defined by the Centers for Medicare and Medicaid Services includes,  in addition to the face-to-face time of a patient visit (documented in the note above) non-face-to-face time: obtaining and reviewing outside history, ordering and reviewing medications, tests or procedures, care coordination (communications with other health care professionals or caregivers) and documentation in the medical record.

## 2024-03-13 ENCOUNTER — Inpatient Hospital Stay: Payer: Commercial Managed Care - PPO | Attending: Hematology and Oncology

## 2024-03-13 ENCOUNTER — Inpatient Hospital Stay (HOSPITAL_BASED_OUTPATIENT_CLINIC_OR_DEPARTMENT_OTHER): Payer: Commercial Managed Care - PPO | Admitting: Hematology and Oncology

## 2024-03-13 VITALS — BP 124/72 | HR 76 | Temp 98.7°F | Resp 16 | Wt 154.0 lb

## 2024-03-13 DIAGNOSIS — Z9221 Personal history of antineoplastic chemotherapy: Secondary | ICD-10-CM | POA: Diagnosis not present

## 2024-03-13 DIAGNOSIS — Z853 Personal history of malignant neoplasm of breast: Secondary | ICD-10-CM | POA: Diagnosis not present

## 2024-03-13 DIAGNOSIS — Z9013 Acquired absence of bilateral breasts and nipples: Secondary | ICD-10-CM | POA: Diagnosis not present

## 2024-03-13 DIAGNOSIS — Z08 Encounter for follow-up examination after completed treatment for malignant neoplasm: Secondary | ICD-10-CM | POA: Insufficient documentation

## 2024-03-13 DIAGNOSIS — Z923 Personal history of irradiation: Secondary | ICD-10-CM | POA: Insufficient documentation

## 2024-03-13 DIAGNOSIS — G629 Polyneuropathy, unspecified: Secondary | ICD-10-CM | POA: Diagnosis not present

## 2024-03-13 LAB — CBC WITH DIFFERENTIAL/PLATELET
Abs Immature Granulocytes: 0.01 10*3/uL (ref 0.00–0.07)
Basophils Absolute: 0.1 10*3/uL (ref 0.0–0.1)
Basophils Relative: 1 %
Eosinophils Absolute: 0.2 10*3/uL (ref 0.0–0.5)
Eosinophils Relative: 3 %
HCT: 38.9 % (ref 36.0–46.0)
Hemoglobin: 13.5 g/dL (ref 12.0–15.0)
Immature Granulocytes: 0 %
Lymphocytes Relative: 43 %
Lymphs Abs: 2.5 10*3/uL (ref 0.7–4.0)
MCH: 29.2 pg (ref 26.0–34.0)
MCHC: 34.7 g/dL (ref 30.0–36.0)
MCV: 84.2 fL (ref 80.0–100.0)
Monocytes Absolute: 0.4 10*3/uL (ref 0.1–1.0)
Monocytes Relative: 7 %
Neutro Abs: 2.7 10*3/uL (ref 1.7–7.7)
Neutrophils Relative %: 46 %
Platelets: 325 10*3/uL (ref 150–400)
RBC: 4.62 MIL/uL (ref 3.87–5.11)
RDW: 12.5 % (ref 11.5–15.5)
WBC: 5.8 10*3/uL (ref 4.0–10.5)
nRBC: 0 % (ref 0.0–0.2)

## 2024-03-13 LAB — COMPREHENSIVE METABOLIC PANEL WITH GFR
ALT: 18 U/L (ref 0–44)
AST: 19 U/L (ref 15–41)
Albumin: 4.4 g/dL (ref 3.5–5.0)
Alkaline Phosphatase: 74 U/L (ref 38–126)
Anion gap: 7 (ref 5–15)
BUN: 16 mg/dL (ref 6–20)
CO2: 27 mmol/L (ref 22–32)
Calcium: 9.3 mg/dL (ref 8.9–10.3)
Chloride: 104 mmol/L (ref 98–111)
Creatinine, Ser: 0.92 mg/dL (ref 0.44–1.00)
GFR, Estimated: >60 mL/min (ref >60)
Glucose, Bld: 95 mg/dL (ref 70–99)
Potassium: 4.3 mmol/L (ref 3.5–5.1)
Sodium: 138 mmol/L (ref 135–145)
Total Bilirubin: 0.7 mg/dL (ref 0.0–1.2)
Total Protein: 7.2 g/dL (ref 6.5–8.1)

## 2024-06-15 ENCOUNTER — Encounter: Payer: Commercial Managed Care - PPO | Admitting: Primary Care

## 2024-06-27 ENCOUNTER — Encounter: Payer: Self-pay | Admitting: Primary Care

## 2024-06-27 ENCOUNTER — Ambulatory Visit (INDEPENDENT_AMBULATORY_CARE_PROVIDER_SITE_OTHER): Admitting: Primary Care

## 2024-06-27 VITALS — BP 106/62 | HR 71 | Temp 98.2°F | Ht 60.0 in | Wt 153.0 lb

## 2024-06-27 DIAGNOSIS — Z13220 Encounter for screening for lipoid disorders: Secondary | ICD-10-CM

## 2024-06-27 DIAGNOSIS — Z853 Personal history of malignant neoplasm of breast: Secondary | ICD-10-CM | POA: Diagnosis not present

## 2024-06-27 DIAGNOSIS — Z0000 Encounter for general adult medical examination without abnormal findings: Secondary | ICD-10-CM

## 2024-06-27 DIAGNOSIS — Z131 Encounter for screening for diabetes mellitus: Secondary | ICD-10-CM

## 2024-06-27 LAB — BASIC METABOLIC PANEL WITH GFR
BUN: 15 mg/dL (ref 6–23)
CO2: 30 meq/L (ref 19–32)
Calcium: 9.5 mg/dL (ref 8.4–10.5)
Chloride: 101 meq/L (ref 96–112)
Creatinine, Ser: 0.87 mg/dL (ref 0.40–1.20)
GFR: 81.47 mL/min (ref >60.00)
Glucose, Bld: 63 mg/dL — ABNORMAL LOW (ref 70–99)
Potassium: 4.4 meq/L (ref 3.5–5.1)
Sodium: 140 meq/L (ref 135–145)

## 2024-06-27 LAB — HEMOGLOBIN A1C: Hgb A1c MFr Bld: 5.9 % (ref 4.6–6.5)

## 2024-06-27 LAB — LIPID PANEL
Cholesterol: 224 mg/dL — ABNORMAL HIGH (ref 0–200)
HDL: 53.8 mg/dL (ref >39.00)
LDL Cholesterol: 149 mg/dL — ABNORMAL HIGH (ref 0–99)
NonHDL: 170.13
Total CHOL/HDL Ratio: 4
Triglycerides: 107 mg/dL (ref 0.0–149.0)
VLDL: 21.4 mg/dL (ref 0.0–40.0)

## 2024-06-27 NOTE — Assessment & Plan Note (Signed)
 Following with oncology, reviewed office notes and labs from May 2025.

## 2024-06-27 NOTE — Progress Notes (Addendum)
 Subjective:    Patient ID: Monica Black, female    DOB: May 09, 1981, 43 y.o.   MRN: [id number]  Monica Black is a very pleasant 43 y.o. female who presents today for complete physical and follow up of chronic conditions.  Immunizations: -Tetanus: Completed in > 10 years ago  -Influenza: Declines    Diet: Fair diet.  Exercise: No regular exercise.  Eye exam: Completes annually  Dental exam: Completes semi-annually    Pap Smear: Complete hysterectomy  Mammogram: Completed double mastectomy   BP Readings from Last 3 Encounters:  06/27/24 106/62  03/13/24 124/72  06/14/23 120/62      Review of Systems  Constitutional:  Negative for unexpected weight change.  HENT:  Negative for rhinorrhea.   Respiratory:  Negative for cough and shortness of breath.   Cardiovascular:  Negative for chest pain.  Gastrointestinal:  Negative for constipation and diarrhea.  Genitourinary:  Negative for difficulty urinating.  Musculoskeletal:  Positive for arthralgias. Negative for myalgias.  Skin:  Negative for rash.  Allergic/Immunologic: Negative for environmental allergies.  Neurological:  Positive for numbness and headaches. Negative for dizziness.  Psychiatric/Behavioral:  The patient is not nervous/anxious.          Past Medical History:  Diagnosis Date   Anxiety    Arthritis    Asthma    mild no meds   Chemotherapy-induced neuropathy (HCC)    Chronic constipation    per pt intermittant   Complication of anesthesia    Headache    migraines   History of cancer chemotherapy    right breast  completed 09-15-2016  and again completed 06/ 2018 after mastectomies   History of external beam radiation therapy    right breast  01/ 2018  to 03/ 2018   Malignant neoplasm of overlapping sites of right breast in female, estrogen receptor positive (HCC) 2017   oncologist--- previously was dr layla , new one is dr shaunna. loretha;  05-18-2016  s/p right breast lumpectomy w/ node  dissection's; local advanced positive / 6 positive nodes  IDC,  Stage III,  ER/PR+,  completed chemo 11/ 2017;;  10-08-2016  s/p bilateral mastectomies w/ node dissection ,  residual IDC, left breast no maligancy,  completed chemo 06/ 2018 and radiation 03/ 2018   Pneumonia    PONV (postoperative nausea and vomiting)    Pre-diabetes    Thickened endometrium     Social History   Socioeconomic History   Marital status: Married    Spouse name: Not on file   Number of children: Not on file   Years of education: Not on file   Highest education level: Bachelor's degree (e.g., BA, AB, BS)  Occupational History   Not on file  Tobacco Use   Smoking status: Never   Smokeless tobacco: Never  Vaping Use   Vaping status: Never Used  Substance and Sexual Activity   Alcohol use: No   Drug use: Never   Sexual activity: Yes    Birth control/protection: None  Other Topics Concern   Not on file  Social History Narrative   Married.   No children.   Will be working with [employer] and Recreation through the [employer].   Enjoys shopping, movies, yoga.     Social Drivers of Corporate investment banker Strain: Low Risk  (06/26/2024)   Overall Financial Resource Strain (CARDIA)    Difficulty of Paying Living Expenses: Not hard at all  Food Insecurity: No Food Insecurity (06/26/2024)  Hunger Vital Sign    Worried About Running Out of Food in the Last Year: Never true    Ran Out of Food in the Last Year: Never true  Transportation Needs: No Transportation Needs (06/26/2024)   PRAPARE - Administrator, Civil Service (Medical): No    Lack of Transportation (Non-Medical): No  Physical Activity: Insufficiently Active (06/26/2024)   Exercise Vital Sign    Days of Exercise per Week: 3 days    Minutes of Exercise per Session: 30 min  Stress: No Stress Concern Present (06/26/2024)   [employer] of Occupational Health - Occupational Stress Questionnaire    Feeling of Stress: Only a  little  Social Connections: Socially Integrated (06/26/2024)   Social Connection and Isolation Panel    Frequency of Communication with Friends and Family: More than three times a week    Frequency of Social Gatherings with Friends and Family: More than three times a week    Attends Religious Services: More than 4 times per year    Active Member of Clubs or Organizations: Yes    Attends Engineer, structural: More than 4 times per year    Marital Status: Married  Catering manager Violence: Not on file    Past Surgical History:  Procedure Laterality Date   BREAST LUMPECTOMY WITH AXILLARY LYMPH NODE DISSECTION Right 05/18/2016   DILATATION & CURETTAGE/HYSTEROSCOPY WITH MYOSURE N/A 01/26/2022   Procedure: DILATATION & CURETTAGE/HYSTEROSCOPY WITH MYOSURE;  Surgeon: Viktoria Comer SAUNDERS, MD;  Location: [facility];  Service: Gynecology;  Laterality: N/A;   ELBOW HARDWARE REMOVAL Left 2013   MODIFIED RADICAL MASTECTOMY W/ AXILLARY LYMPH NODE DISSECTION Bilateral 10/08/2016   ORIF ELBOW FRACTURE Left 2012   ROBOTIC ASSISTED TOTAL HYSTERECTOMY WITH BILATERAL SALPINGO OOPHERECTOMY Bilateral 03/02/2022   Procedure: XI ROBOTIC ASSISTED TOTAL HYSTERECTOMY WITH BILATERAL SALPINGO OOPHORECTOMY;  Surgeon: Viktoria Comer SAUNDERS, MD;  Location: [facility];  Service: Gynecology;  Laterality: Bilateral;    Family History  Problem Relation Age of Onset   Other Mother        adopted, family history unk   Asthma Father    Brain cancer Paternal Grandfather    Parkinson's disease Paternal Uncle    Breast cancer Neg Hx    Ovarian cancer Neg Hx     Allergies  Allergen Reactions   Penicillins Hives   Tylenol  [Acetaminophen ] Itching   Latex Rash    Current Outpatient Medications on File Prior to Visit  Medication Sig Dispense Refill   Multiple Vitamin (MULTIVITAMIN) LIQD Take 30 mLs by mouth daily.     cetirizine  (ZYRTEC ) 10 MG tablet Take 10 mg by mouth daily as needed for allergies.  (Patient not taking: Reported on 06/27/2024)     No current facility-administered medications on file prior to visit.    BP 106/62   Pulse 71   Temp 98.2 F (36.8 C) (Temporal)   Ht 1' (0.305 m)   Wt 153 lb (69.4 kg)   LMP 02/24/2022   SpO2 97%   BMI 747.02 kg/m  Objective:   Physical Exam HENT:     Right Ear: Tympanic membrane and ear canal normal.     Left Ear: Tympanic membrane and ear canal normal.  Eyes:     Pupils: Pupils are equal, round, and reactive to light.  Cardiovascular:     Rate and Rhythm: Normal rate and regular rhythm.  Pulmonary:     Effort: Pulmonary effort is normal.     Breath  sounds: Normal breath sounds.  Abdominal:     General: Bowel sounds are normal.     Palpations: Abdomen is soft.     Tenderness: There is no abdominal tenderness.  Musculoskeletal:        General: Normal range of motion.     Cervical back: Neck supple.  Skin:    General: Skin is warm and dry.  Neurological:     Mental Status: She is alert and oriented to person, place, and time.     Cranial Nerves: No cranial nerve deficit.     Deep Tendon Reflexes:     Reflex Scores:      Patellar reflexes are 2+ on the right side and 2+ on the left side. Psychiatric:        Mood and Affect: Mood normal.     Physical Exam        Assessment & Plan:  Preventative health care Assessment & Plan: Immunizations UTD. Declines influenza vaccine today. Pap smear N/A given complete hysterectomy  Mammogram N/A given double mastectomy.   Discussed the importance of a healthy diet and regular exercise in order for weight loss, and to reduce the risk of further co-morbidity.  Exam stable. Labs pending reviewed today and pending.   Follow up in 1 year for repeat physical.   Orders: -     Hemoglobin A1c -     Lipid panel -     Basic metabolic panel with GFR  History of breast cancer Assessment & Plan: Following with oncology, reviewed office notes and labs from May  2025.      Assessment and Plan Assessment & Plan         Comer MARLA Gaskins, NP     History of Present Illness

## 2024-06-27 NOTE — Assessment & Plan Note (Signed)
 Immunizations UTD. Declines influenza vaccine today. Pap smear N/A given complete hysterectomy  Mammogram N/A given double mastectomy.   Discussed the importance of a healthy diet and regular exercise in order for weight loss, and to reduce the risk of further co-morbidity.  Exam stable. Labs pending reviewed today and pending.   Follow up in 1 year for repeat physical.

## 2024-06-27 NOTE — Patient Instructions (Signed)
 Stop by the lab prior to leaving today. I will notify you of your results once received.   It was a pleasure to see you today!

## 2024-06-28 ENCOUNTER — Ambulatory Visit: Payer: Self-pay | Admitting: Primary Care

## 2024-06-28 DIAGNOSIS — R7303 Prediabetes: Secondary | ICD-10-CM

## 2024-06-28 NOTE — Telephone Encounter (Signed)
 Form faxed, original placed up front for pickup.  Copy sent to scanning.

## 2025-03-12 ENCOUNTER — Other Ambulatory Visit

## 2025-03-12 ENCOUNTER — Ambulatory Visit: Admitting: Hematology and Oncology

## 2025-06-28 ENCOUNTER — Encounter: Admitting: Primary Care
